# Patient Record
Sex: Female | Born: 1980 | Race: Black or African American | Hispanic: No | Marital: Single | Smoking: Current every day smoker
Health system: Southern US, Community
[De-identification: ages and names within clinical notes are randomized; demographics above are authoritative.]

## PROBLEM LIST (undated history)

## (undated) DIAGNOSIS — K8020 Calculus of gallbladder without cholecystitis without obstruction: Secondary | ICD-10-CM

## (undated) DIAGNOSIS — F329 Major depressive disorder, single episode, unspecified: Secondary | ICD-10-CM

## (undated) DIAGNOSIS — I10 Essential (primary) hypertension: Secondary | ICD-10-CM

## (undated) DIAGNOSIS — F32A Depression, unspecified: Secondary | ICD-10-CM

## (undated) DIAGNOSIS — H5440 Blindness, one eye, unspecified eye: Secondary | ICD-10-CM

## (undated) DIAGNOSIS — K219 Gastro-esophageal reflux disease without esophagitis: Secondary | ICD-10-CM

## (undated) DIAGNOSIS — J45909 Unspecified asthma, uncomplicated: Secondary | ICD-10-CM

## (undated) DIAGNOSIS — H532 Diplopia: Secondary | ICD-10-CM

## (undated) DIAGNOSIS — Z6841 Body Mass Index (BMI) 40.0 and over, adult: Secondary | ICD-10-CM

## (undated) HISTORY — DX: Unspecified asthma, uncomplicated: J45.909

## (undated) HISTORY — PX: HERNIA REPAIR: SHX51

## (undated) HISTORY — DX: Body Mass Index (BMI) 40.0 and over, adult: Z684

## (undated) HISTORY — DX: Calculus of gallbladder without cholecystitis without obstruction: K80.20

## (undated) HISTORY — PX: EYE SURGERY: SHX253

## (undated) HISTORY — DX: Morbid (severe) obesity due to excess calories: E66.01

## (undated) HISTORY — DX: Diplopia: H53.2

---

## 2005-02-16 ENCOUNTER — Emergency Department: Payer: Self-pay | Admitting: Internal Medicine

## 2005-04-23 ENCOUNTER — Emergency Department: Payer: Self-pay | Admitting: Emergency Medicine

## 2005-10-21 ENCOUNTER — Observation Stay: Payer: Self-pay

## 2005-10-28 ENCOUNTER — Observation Stay: Payer: Self-pay | Admitting: Unknown Physician Specialty

## 2005-10-30 ENCOUNTER — Ambulatory Visit: Payer: Self-pay | Admitting: Unknown Physician Specialty

## 2005-10-31 ENCOUNTER — Observation Stay: Payer: Self-pay

## 2005-11-02 ENCOUNTER — Inpatient Hospital Stay: Payer: Self-pay | Admitting: Obstetrics & Gynecology

## 2005-11-03 ENCOUNTER — Other Ambulatory Visit: Payer: Self-pay

## 2007-04-03 ENCOUNTER — Ambulatory Visit: Payer: Self-pay | Admitting: Ophthalmology

## 2007-06-26 ENCOUNTER — Emergency Department: Payer: Self-pay | Admitting: Emergency Medicine

## 2007-12-17 IMAGING — US US OB US >=[ID] SNGL FETUS
1 series · 17 of 28 positions shown · non-contrast
Comparison: none

REASON FOR EXAM: Pregnant AFT Est Fetal Wt
COMMENTS:

[Series 1: us ob us >=(id) sngl fetus · 17 of 38 slices shown]
[im 1/38]
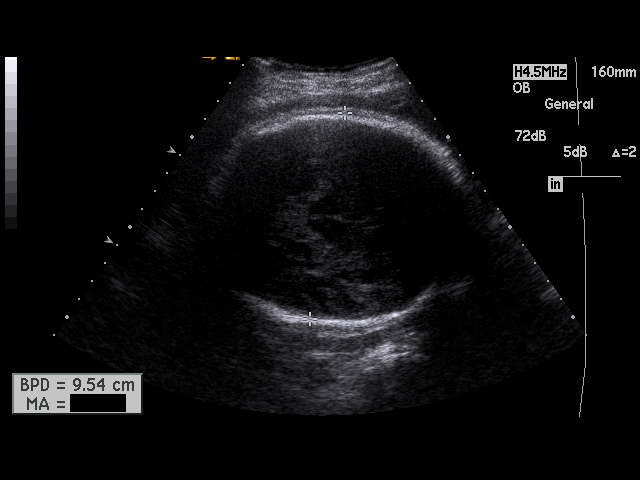
[im 3/38]
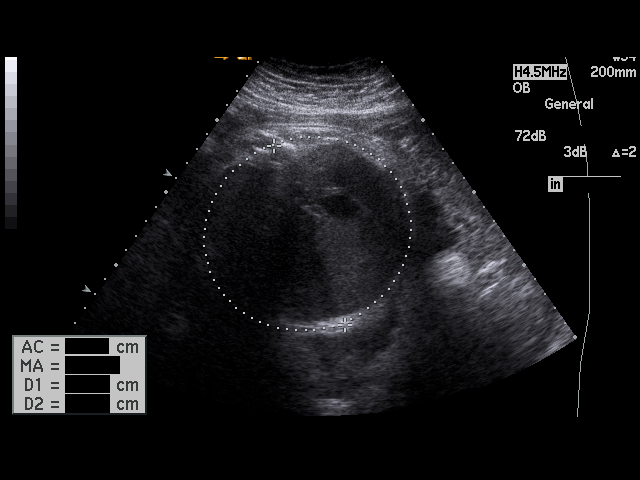
[im 6/38]
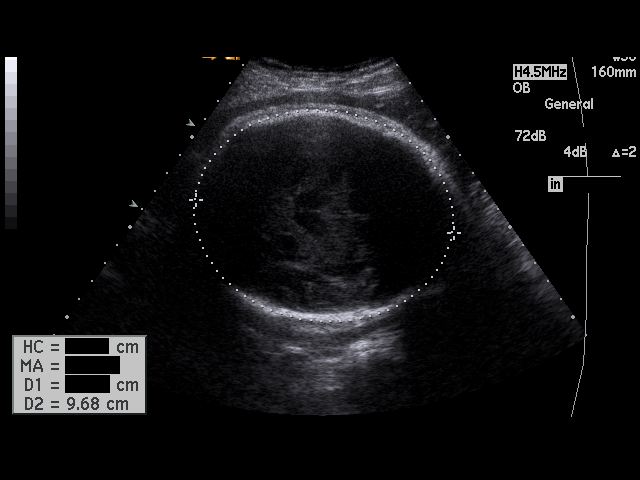
[im 7/38]
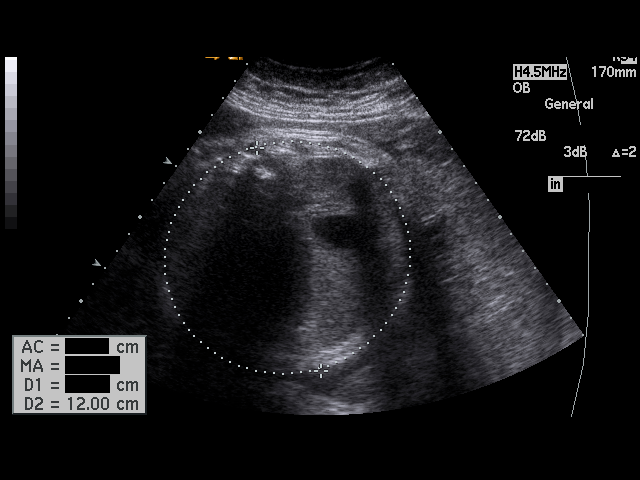
[im 10/38]
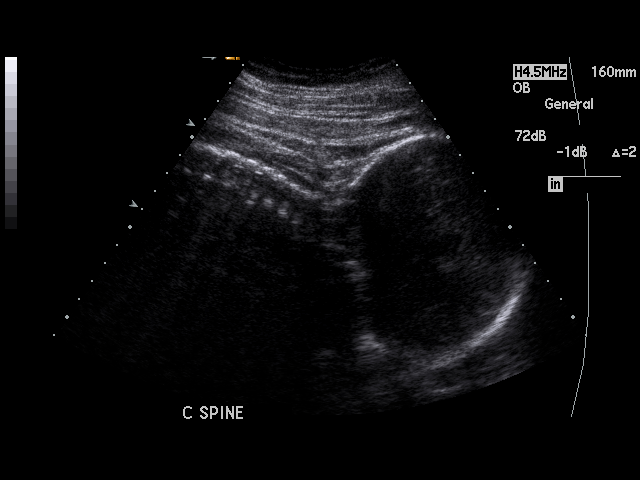
[im 13/38]
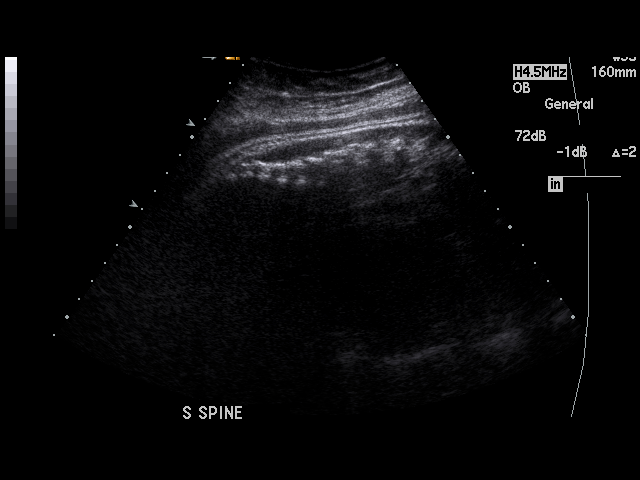
[im 14/38]
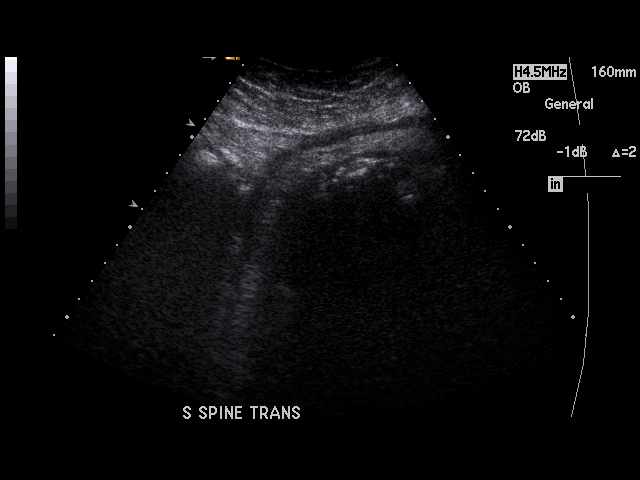
[im 17/38]
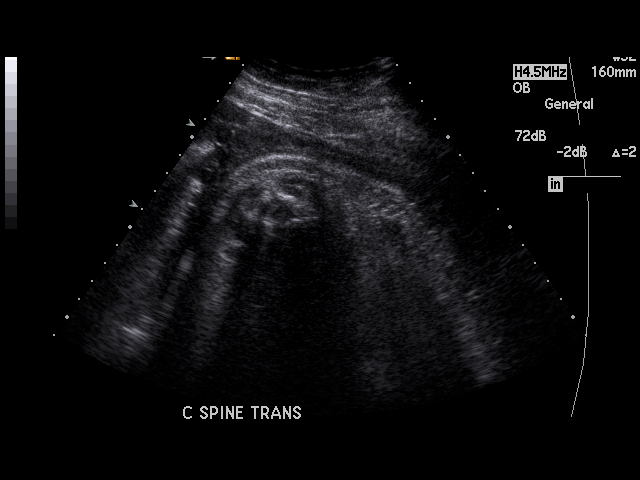
[im 20/38]
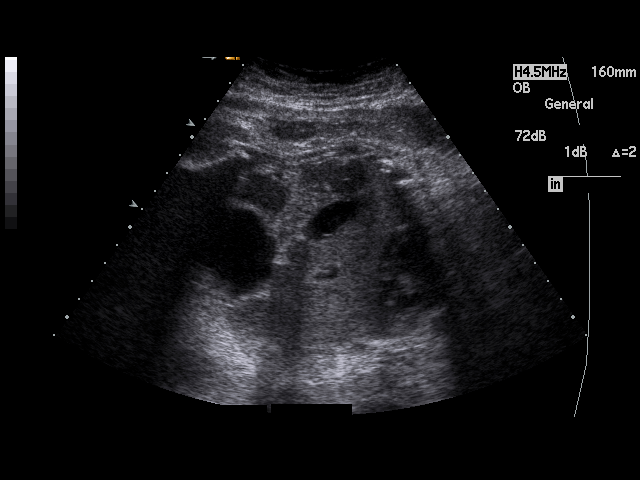
[im 21/38]
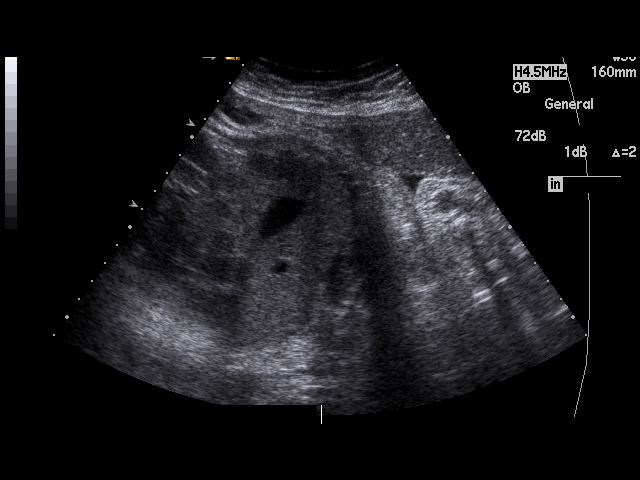
[im 24/38]
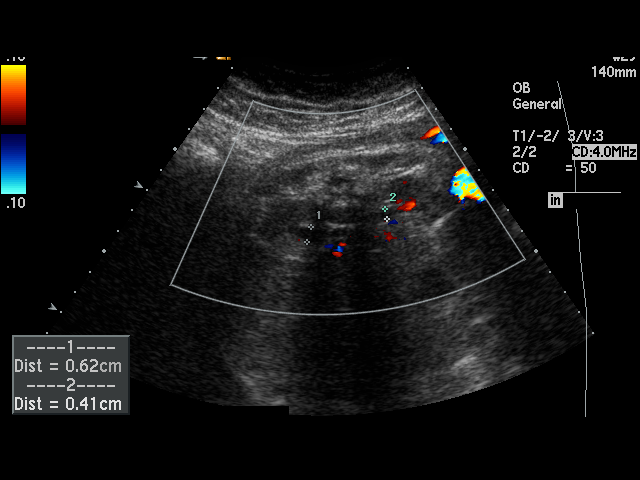
[im 25/38]
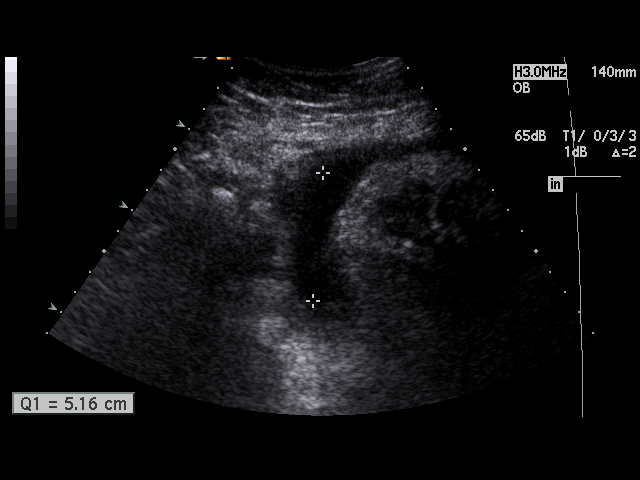
[im 28/38]
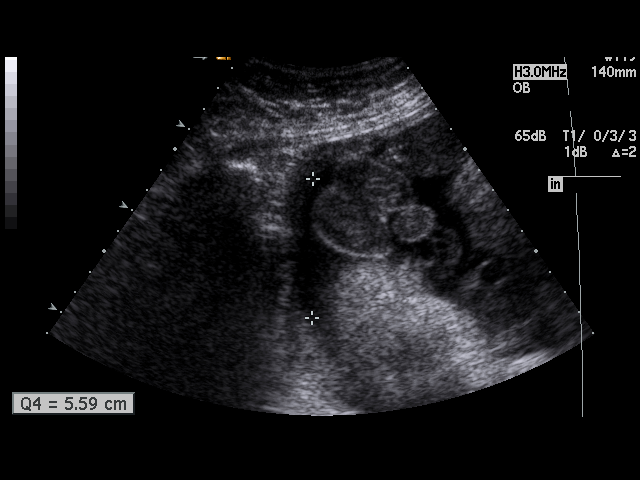
[im 31/38]
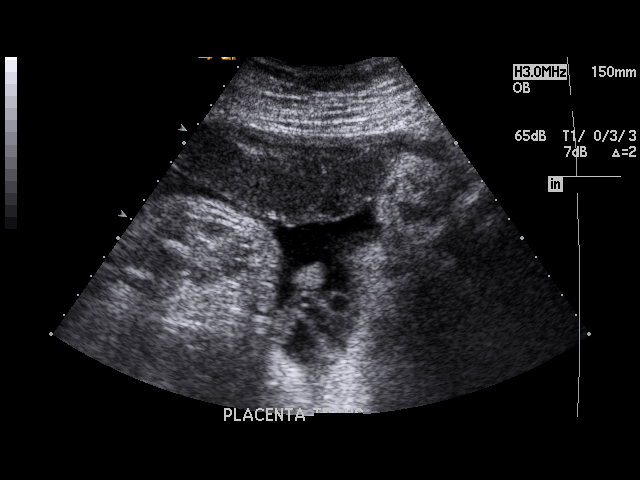
[im 32/38]
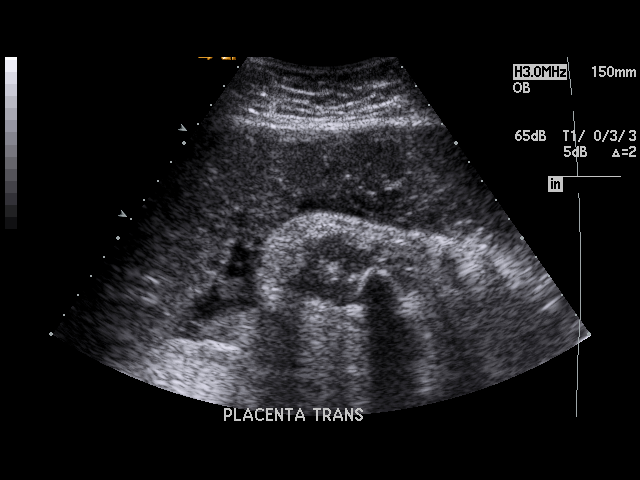
[im 35/38]
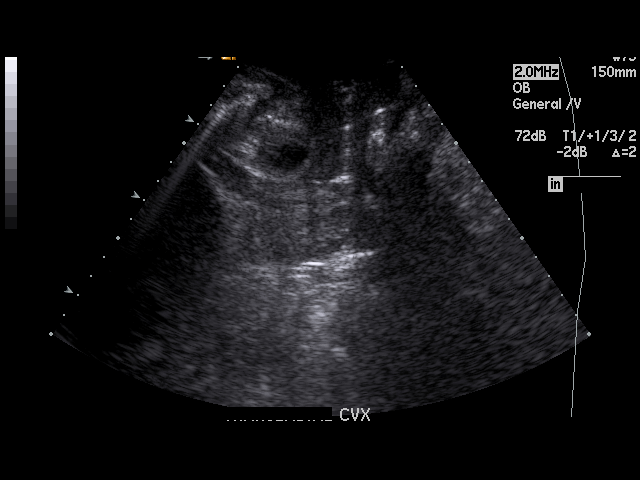
[im 38/38]
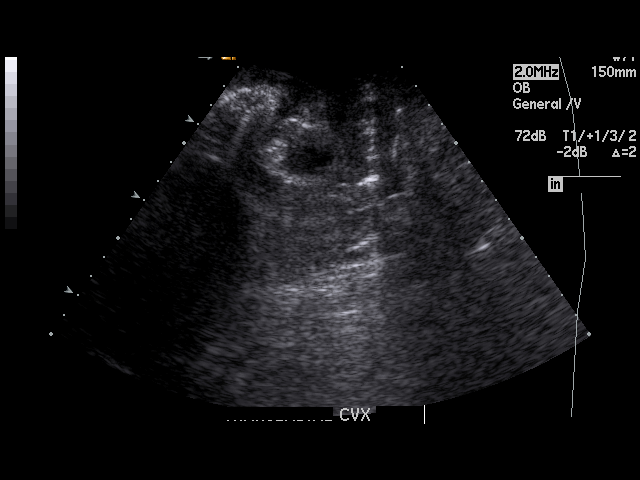

[17 of 28 positions shown; findings below may reference images not displayed]

PROCEDURE:     US  - US OB GREATER/OR EQUAL TO ZNRMS  - October 30, 2005 [DATE]

RESULT:        This study is limited secondary to the size of the fetus.

A single viable intrauterine pregnancy is appreciated with estimated
gestational age of 39 weeks 3 days.  Estimated fetal weight is 8 pounds 9
ounces corresponding to 3,890 grams.  Estimated fetal heart rate is 167
beats per minute.  Evaluation of the amniotic fluid demonstrates the
amniotic fluid index of 10.75 which is between the fifth and fiftieth
percentile.

Cardiac movement is appreciated.  The presentation is cephalic.  Limited
evaluation of the fetal anatomy demonstrates no bladder, renal, diaphragm,
spine or ventricular abnormalities.  The placenta is Grade II and is in a
LEFT lateral anterior location.

Fetal biometry:

BPD     9.45          EGA      39 weeks 3 days
HC          33.99           EGA       39 weeks 1 day
AC          36.13          EGA     40 weeks
FL          7.87          EGA      40 weeks 1 day

These measurements are in centimeters.
IMPRESSION: Single viable intrauterine pregnancy as described above.  Estimated
gestational age 39 weeks 3 days.

## 2008-03-17 ENCOUNTER — Emergency Department: Payer: Self-pay | Admitting: Emergency Medicine

## 2011-03-20 ENCOUNTER — Emergency Department: Payer: Self-pay | Admitting: Emergency Medicine

## 2011-03-20 LAB — COMPREHENSIVE METABOLIC PANEL
Albumin: 3.6 g/dL (ref 3.4–5.0)
Alkaline Phosphatase: 58 U/L (ref 50–136)
BUN: 9 mg/dL (ref 7–18)
Bilirubin,Total: 0.2 mg/dL (ref 0.2–1.0)
Calcium, Total: 9 mg/dL (ref 8.5–10.1)
Chloride: 106 mmol/L (ref 98–107)
EGFR (African American): >60
Glucose: 83 mg/dL (ref 65–99)
Potassium: 4.2 mmol/L (ref 3.5–5.1)
SGOT(AST): 158 U/L — ABNORMAL HIGH (ref 15–37)
SGPT (ALT): 127 U/L — ABNORMAL HIGH
Sodium: 144 mmol/L (ref 136–145)
Total Protein: 7.5 g/dL (ref 6.4–8.2)

## 2011-03-20 LAB — CBC
HGB: 12.2 g/dL (ref 12.0–16.0)
RBC: 3.89 10*6/uL (ref 3.80–5.20)

## 2013-06-02 DIAGNOSIS — H532 Diplopia: Secondary | ICD-10-CM | POA: Insufficient documentation

## 2013-12-18 ENCOUNTER — Ambulatory Visit: Payer: Self-pay | Admitting: Obstetrics and Gynecology

## 2013-12-18 LAB — HCG, QUANTITATIVE, PREGNANCY

## 2013-12-18 LAB — CBC
HCT: 31.1 % — ABNORMAL LOW (ref 35.0–47.0)
HGB: 9.8 g/dL — AB (ref 12.0–16.0)
MCH: 30.2 pg (ref 26.0–34.0)
MCHC: 31.6 g/dL — ABNORMAL LOW (ref 32.0–36.0)
MCV: 96 fL (ref 80–100)
Platelet: 345 10*3/uL (ref 150–440)
RBC: 3.26 10*6/uL — AB (ref 3.80–5.20)
RDW: 16.2 % — AB (ref 11.5–14.5)
WBC: 18.2 10*3/uL — AB (ref 3.6–11.0)

## 2014-04-29 NOTE — Op Note (Signed)
PATIENT NAME:  Carmen Gomez, Allisha V MR#:  [id number] DATE OF BIRTH:  1980/10/21  DATE OF PROCEDURE:  12/18/2013  PREOPERATIVE DIAGNOSIS: Vaginal laceration after intercourse.   POSTOPERATIVE DIAGNOSIS: Vaginal laceration after intercourse.   PROCEDURE: Repair of vaginal laceration.   ANESTHESIA: MAC.   SURGEON: Conard NovakStephen D. Choua Ikner, MD.   ESTIMATED BLOOD LOSS: 40 mL.   OPERATIVE FLUIDS: 400 mL crystalloid.   COMPLICATIONS: None.   FINDINGS: A 4 cm right vaginal laceration at about 7 o'clock with respect to the cervix in the proximal vagina extending inferior to the cervix for about 2 cm where the cervix and vagina connect in the fornix.   SPECIMENS: None.   CONDITION AT THE END OF PROCEDURE: Stable.   PROCEDURE IN DETAIL: The patient was taken to the Operating Room where monitored anesthesia was administered and found to be adequate. The patient was placed in the dorsal supine lithotomy position in [place] stirrups and prepped and draped in the usual sterile fashion. After a timeout was called, a sterile speculum was placed in the vagina and the vagina was explored thoroughly to ensure no other defects were missed. The distal portion of the defect was then grasped with a clamp and a running suture of 3-0 Vicryl was used to reapproximate up to the level of the cervix. This was then tied off. A second suture was used to reapproximate where the defect was in a circumferential fashion at the fornix posterior to the cervix. At the end of the procedure, there was hemostasis along the entire suture line and the defect was completely closed. There was no active bleeding at the end of the procedure. At this point, the procedure was terminated and all instrumentation was removed from the vagina.   The patient tolerated the procedure well. Sponge, lap, and needle counts were correct x 2. For VTE prophylaxis, the patient was wearing pneumatic compression stockings throughout the entire procedure. She was  awakened in the Operating Room and taken to the recovery area in stable condition.    ____________________________ Conard NovakStephen D. Khylei Wilms, MD sdj:at D: 12/18/2013 23:48:24 ET T: 12/19/2013 10:00:00 ET JOB#: [id number]  cc: Conard NovakStephen D. Leila Schuff, MD, <Dictator> Conard NovakSTEPHEN D Kamry Faraci MD ELECTRONICALLY SIGNED 01/15/2014 13:08

## 2015-05-25 ENCOUNTER — Emergency Department
Admission: EM | Admit: 2015-05-25 | Discharge: 2015-05-25 | Disposition: A | Discharge status: Home / Self Care | Payer: Medicaid Other | Attending: Emergency Medicine | Admitting: Emergency Medicine

## 2015-05-25 ENCOUNTER — Emergency Department: Payer: Medicaid Other

## 2015-05-25 DIAGNOSIS — Y999 Unspecified external cause status: Secondary | ICD-10-CM | POA: Diagnosis not present

## 2015-05-25 DIAGNOSIS — Y939 Activity, unspecified: Secondary | ICD-10-CM | POA: Insufficient documentation

## 2015-05-25 DIAGNOSIS — T188XXA Foreign body in other parts of alimentary tract, initial encounter: Secondary | ICD-10-CM | POA: Diagnosis present

## 2015-05-25 DIAGNOSIS — W228XXA Striking against or struck by other objects, initial encounter: Secondary | ICD-10-CM | POA: Insufficient documentation

## 2015-05-25 DIAGNOSIS — Y929 Unspecified place or not applicable: Secondary | ICD-10-CM | POA: Insufficient documentation

## 2015-05-25 DIAGNOSIS — T189XXA Foreign body of alimentary tract, part unspecified, initial encounter: Secondary | ICD-10-CM

## 2015-05-25 MED ORDER — LIDOCAINE VISCOUS 2 % MT SOLN: 20.0000 mL | OROMUCOSAL | Status: DC | PRN

## 2015-05-25 MED ORDER — LIDOCAINE VISCOUS 2 % MT SOLN
15.0000 mL | Freq: Once | OROMUCOSAL | Status: AC
  Administered 2015-05-25: 15 mL via OROMUCOSAL
  Filled 2015-05-25: qty 15

## 2015-05-25 NOTE — ED Provider Notes (Signed)
[facility] Emergency Department Provider Note  ____________________________________________  Time seen: Approximately 12:30 PM  I have reviewed the triage vital signs and the nursing notes.   HISTORY  Chief Complaint Foreign Body    HPI Sharen CounterDanielle V Shiflett is a 35 y.o. female is for evaluation of patient states that she swallowed a small piece of plastic from a freeze pop. States that she feels like a stuck in her throat she can't swallow showed a difficult time breathing and now she is having chest pains. Describes her level of uncomfortableness as 10 over 10   No past medical history on file.  There are no active problems to display for this patient.   No past surgical history on file.  Current Outpatient Rx  Name  Route  Sig  Dispense  Refill  . lidocaine (XYLOCAINE) 2 % solution   Mouth/Throat   Use as directed 20 mLs in the mouth or throat as needed for mouth pain.   100 mL   0     Allergies Review of patient's allergies indicates no known allergies.  No family history on file.  Social History Social History  Substance Use Topics  . Smoking status: Not on file  . Smokeless tobacco: Not on file  . Alcohol Use: Not on file    Review of Systems Constitutional: No fever/chills WUJ:WJXBJYNWENT:Positive for questionable foreign body in throat. Cardiovascular: Denies chest pain. Respiratory: Denies shortness of breath. Gastrointestinal: No abdominal pain.  No nausea, no vomiting.  No diarrhea.  No constipation. Skin: Negative for rash. Neurological: Negative for headaches, focal weakness or numbness.  10-point ROS otherwise negative.  ____________________________________________   PHYSICAL EXAM:  VITAL SIGNS: ED Triage Vitals  Enc Vitals Group     BP 05/25/15 1052 159/103 mmHg     Pulse Rate 05/25/15 1052 85     Resp 05/25/15 1052 16     Temp 05/25/15 1052 97.9 F (36.6 C)     Temp Source 05/25/15 1052 Oral     SpO2 05/25/15 1052 99  %     Weight 05/25/15 1052 300 lb (136.079 kg)     Height 05/25/15 1052 5\' 9"  (1.753 m)     Head Cir --      Peak Flow --      Pain Score --      Pain Loc --      Pain Edu? --      Excl. in GC? --     Constitutional: Alert and oriented. Well appearing and in no acute distress. Mouth/Throat: Mucous membranes are moist.  Oropharynx non-erythematous. Neck: No stridor. Range of motion nontender. Clear to auscultation.  Cardiovascular: Normal rate, regular rhythm. Grossly normal heart sounds.  Good peripheral circulation. Respiratory: Normal respiratory effort.  No retractions. Lungs CTAB. Neurologic:  Normal speech and language. No gross focal neurologic deficits are appreciated. No gait instability. Skin:  Skin is warm, dry and intact. No rash noted. Psychiatric: Mood and affect are normal. Speech and behavior are normal.  ____________________________________________   LABS (all labs ordered are listed, but only abnormal results are displayed)  Labs Reviewed - No data to display  RADIOLOGY  IMPRESSION: 1. I do not see a definite foreign body. I am uncertain whether a small piece of plastic would show up well on conventional radiography. CT could be utilized if there is a high suspicion of occult retained foreign body. ____________________________________________   PROCEDURES  Procedure(s) performed: None  Critical Care performed: No  ____________________________________________  INITIAL IMPRESSION / ASSESSMENT AND PLAN / ED COURSE  Pertinent labs & imaging results that were available during my care of the patient were reviewed by me and considered in my medical decision making (see chart for details).  Question foreign body ingestion. Patient states she feels better after the swallowing viscous lidocaine. No obvious foreign body noted on x-ray patient for CT at this time and will follow-up with PCP or return to the ER if anything  worsens. ____________________________________________   FINAL CLINICAL IMPRESSION(S) / ED DIAGNOSES  Final diagnoses:  Foreign body ingestion, initial encounter     This chart was dictated using voice recognition software/Dragon. Despite best efforts to proofread, errors can occur which can change the meaning. Any change was purely unintentional.   Evangeline Dakin, PA-C 05/25/15 1329  Jene Every, MD 05/25/15 [phone]

## 2015-05-25 NOTE — Discharge Instructions (Signed)
Swallowed Foreign Body, Adult °A swallowed foreign body means that you swallow something and it gets stuck. It might be food or something else. The object may get stuck in the tube that connects your throat to your stomach (esophagus), or it may get stuck in another part of your belly (digestive tract). It is very important to tell your doctor what you have swallowed. °Sometimes, the object will pass through your body on its own. Sometimes, the object will pass after you are given a medicine to relax your throat. The object may need to be taken out by a doctor if it is dangerous or if it will not pass through your body on its own. An object may need to be removed with surgery if: °· It gets stuck in your throat. °· You cannot swallow. °· You cannot breathe well. °· It is sharp. °· It is harmful or poisonous (toxic), like batteries or drugs. °HOME CARE °· Eat what you normally eat if your doctor says that you can. °· Keep checking your poop (stool) to see if the object has come out of your body. °· Call your doctor if the object has not come out of your body after 3 days. °If you had surgery (endoscopy) to remove the object: °· Care for yourself after surgery as told by your doctor. °Keep all follow-up visits as told by your doctor. This is important. °SEEK MEDICAL CARE IF: °· You still have problems after you have been treated. °· The object has not come out of your body after 3 days. °GET HELP RIGHT AWAY IF: °· You have a fever. °· You have pain in your chest or your belly. °· You cough up blood. °· You have blood in your poop or your throw up (vomit). °  °This information is not intended to replace advice given to you by your health care provider. Make sure you discuss any questions you have with your health care provider. °  °Document Released: 04/05/2010 Document Revised: 09/09/2014 Document Reviewed: 03/18/2014 °Elsevier Interactive Patient Education ©2016 Elsevier Inc. ° °

## 2015-05-25 NOTE — ED Notes (Signed)
Pt states she was eating a freeze pop this morning and felt like a piece of plastic got hung in her throat from the freeze pop, denies any SOB or difficulty  Swallowing, pt is in NAD..Marland Kitchen

## 2015-05-25 NOTE — ED Notes (Signed)
See triage note   States she thinks she swallowed a small piece of plastic  And feels like it is in throat  No drooling noted  Speech clear   Positive nausea

## 2015-06-25 ENCOUNTER — Emergency Department: Payer: Medicaid Other

## 2015-06-25 ENCOUNTER — Emergency Department
Admission: EM | Admit: 2015-06-25 | Discharge: 2015-06-25 | Disposition: A | Discharge status: Home / Self Care | Payer: Medicaid Other | Attending: Student | Admitting: Student

## 2015-06-25 ENCOUNTER — Encounter: Payer: Self-pay | Admitting: Emergency Medicine

## 2015-06-25 DIAGNOSIS — W19XXXA Unspecified fall, initial encounter: Secondary | ICD-10-CM | POA: Diagnosis not present

## 2015-06-25 DIAGNOSIS — Y929 Unspecified place or not applicable: Secondary | ICD-10-CM | POA: Diagnosis not present

## 2015-06-25 DIAGNOSIS — Y9389 Activity, other specified: Secondary | ICD-10-CM | POA: Diagnosis not present

## 2015-06-25 DIAGNOSIS — F17200 Nicotine dependence, unspecified, uncomplicated: Secondary | ICD-10-CM | POA: Insufficient documentation

## 2015-06-25 DIAGNOSIS — Y999 Unspecified external cause status: Secondary | ICD-10-CM | POA: Diagnosis not present

## 2015-06-25 DIAGNOSIS — S93401A Sprain of unspecified ligament of right ankle, initial encounter: Secondary | ICD-10-CM | POA: Diagnosis not present

## 2015-06-25 DIAGNOSIS — S8991XA Unspecified injury of right lower leg, initial encounter: Secondary | ICD-10-CM | POA: Diagnosis present

## 2015-06-25 DIAGNOSIS — S82831A Other fracture of upper and lower end of right fibula, initial encounter for closed fracture: Secondary | ICD-10-CM | POA: Diagnosis not present

## 2015-06-25 DIAGNOSIS — S82401A Unspecified fracture of shaft of right fibula, initial encounter for closed fracture: Secondary | ICD-10-CM

## 2015-06-25 MED ORDER — OXYCODONE-ACETAMINOPHEN 5-325 MG PO TABS
1.0000 | ORAL_TABLET | Freq: Once | ORAL | Status: AC
  Administered 2015-06-25: 1 via ORAL
  Filled 2015-06-25: qty 1

## 2015-06-25 MED ORDER — NAPROXEN 500 MG PO TBEC: 500.0000 mg | DELAYED_RELEASE_TABLET | Freq: Two times a day (BID) | ORAL | Status: DC

## 2015-06-25 MED ORDER — HYDROCODONE-ACETAMINOPHEN 5-325 MG PO TABS: 1.0000 | ORAL_TABLET | Freq: Four times a day (QID) | ORAL | Status: DC | PRN

## 2015-06-25 NOTE — ED Notes (Signed)
Dahlia ClientHannah student PA at bedside.

## 2015-06-25 NOTE — Discharge Instructions (Signed)
Cast or Splint Care Casts and splints support injured limbs and keep bones from moving while they heal.  HOME CARE  Keep the cast or splint uncovered during the drying period.  A plaster cast can take 24 to 48 hours to dry.  A fiberglass cast will dry in less than 1 hour.  Do not rest the cast on anything harder than a pillow for 24 hours.  Do not put weight on your injured limb. Do not put pressure on the cast. Wait for your doctor's approval.  Keep the cast or splint dry.  Cover the cast or splint with a plastic bag during baths or wet weather.  If you have a cast over your chest and belly (trunk), take sponge baths until the cast is taken off.  If your cast gets wet, dry it with a towel or blow dryer. Use the cool setting on the blow dryer.  Keep your cast or splint clean. Wash a dirty cast with a damp cloth.  Do not put any objects under your cast or splint.  Do not scratch the skin under the cast with an object. If itching is a problem, use a blow dryer on a cool setting over the itchy area.  Do not trim or cut your cast.  Do not take out the padding from inside your cast.  Exercise your joints near the cast as told by your doctor.  Raise (elevate) your injured limb on 1 or 2 pillows for the first 1 to 3 days. GET HELP IF:  Your cast or splint cracks.  Your cast or splint is too tight or too loose.  You itch badly under the cast.  Your cast gets wet or has a soft spot.  You have a bad smell coming from the cast.  You get an object stuck under the cast.  Your skin around the cast becomes red or sore.  You have new or more pain after the cast is put on. GET HELP RIGHT AWAY IF:  You have fluid leaking through the cast.  You cannot move your fingers or toes.  Your fingers or toes turn blue or white or are cool, painful, or puffy (swollen).  You have tingling or lose feeling (numbness) around the injured area.  You have bad pain or pressure under the  cast.  You have trouble breathing or have shortness of breath.  You have chest pain.   This information is not intended to replace advice given to you by your health care provider. Make sure you discuss any questions you have with your health care provider.   Document Released: 04/20/2010 Document Revised: 08/21/2012 Document Reviewed: 06/27/2012 Elsevier Interactive Patient Education 2016 Elsevier Inc.  Fibular Ankle Fracture Treated With or Without Immobilization, Adult A fibular fracture at your ankle is a break (fracture) bone in the smallest of the two bones in your lower leg, located on the outside of your leg (fibula) close to the area at your ankle joint. CAUSES  Rolling your ankle.  Twisting your ankle.  Extreme flexing or extending of your foot.  Severe force on your ankle as when falling from a distance. RISK FACTORS  Jumping activities.  Participation in sports.  Osteoporosis.  Advanced age.  Previous ankle injuries. SIGNS AND SYMPTOMS  Pain.  Swelling.  Inability to put weight on injured ankle.  Bruising.  Bone deformities at site of injury. DIAGNOSIS  This fracture is diagnosed with the help of an X-ray exam. TREATMENT  If the fractured  bone did not move out of place it usually will heal without problems and does casting or splinting. If immobilization is needed for comfort or the fractured bone moved out of place and will not heal properly with immobilization, a cast or splint will be used. HOME CARE INSTRUCTIONS   Apply ice to the area of injury:  Put ice in a plastic bag.  Place a towel between your skin and the bag.  Leave the ice on for 20 minutes, 2-3 times a day.  Use crutches as directed. Resume walking without crutches as directed by your health care provider.  Only take over-the-counter or prescription medicines for pain, discomfort, or fever as directed by your health care provider.  If you have a removable splint or boot, do not  remove the boot unless directed by your health care provider. SEEK MEDICAL CARE IF:   You have continued pain or more swelling  The medications do not control the pain. SEEK IMMEDIATE MEDICAL CARE IF:  You develop severe pain in the leg or foot.  Your skin or nails below the injury turn blue or grey or feel cold or numb. MAKE SURE YOU:   Understand these instructions.  Will watch your condition.  Will get help right away if you are not doing well or get worse.   This information is not intended to replace advice given to you by your health care provider. Make sure you discuss any questions you have with your health care provider.   Document Released: 12/19/2004 Document Revised: 01/09/2014 Document Reviewed: 07/31/2012 Elsevier Interactive Patient Education 2016 Elsevier Inc.   Wear the ankle splint until cleared by ortho. Follow-up with Dr. Ernest PineHooten for further evaluation and fracture management. Take the prescription meds as directed.

## 2015-06-25 NOTE — ED Notes (Signed)
Pt verbalized understanding of discharge instructions. NAD at this time. 

## 2015-06-25 NOTE — ED Provider Notes (Signed)
[facility] Emergency Department Provider Note ____________________________________________  Time seen: 1405  I have reviewed the triage vital signs and the nursing notes.  HISTORY  Chief Complaint  Fall  HPI Carmen Gomez is a 35 y.o. female presents to the ED via EMS for evaluation of injury sustained following a fall last night while emptying the trash. She describes pain to the right ankle and proximal leg. She denies any other injury at this time. She rates her pain at 10/10 in triage. She denies any measures at home to treat or alleviate pain or swelling.   History reviewed. No pertinent past medical history.  There are no active problems to display for this patient.  Past Surgical History  Procedure Laterality Date  . Eye surgery      Current Outpatient Rx  Name  Route  Sig  Dispense  Refill  . HYDROcodone-acetaminophen (NORCO) 5-325 MG tablet   Oral   Take 1 tablet by mouth every 6 (six) hours as needed for moderate pain.   15 tablet   0   . lidocaine (XYLOCAINE) 2 % solution   Mouth/Throat   Use as directed 20 mLs in the mouth or throat as needed for mouth pain.   100 mL   0   . naproxen (EC NAPROSYN) 500 MG EC tablet   Oral   Take 1 tablet (500 mg total) by mouth 2 (two) times daily with a meal.   30 tablet   0    Allergies Review of patient's allergies indicates no known allergies.  No family history on file.  Social History Social History  Substance Use Topics  . Smoking status: Light Tobacco Smoker  . Smokeless tobacco: None  . Alcohol Use: Yes    Review of Systems  Constitutional: Negative for fever. Musculoskeletal: Negative for back pain. Right ankle pain as above.  Skin: Negative for rash. Neurological: Negative for headaches, focal weakness or numbness. ____________________________________________  PHYSICAL EXAM:  VITAL SIGNS: ED Triage Vitals  Enc Vitals Group     BP 06/25/15 1402 160/87 mmHg   Pulse Rate 06/25/15 1402 94     Resp 06/25/15 1402 18     Temp 06/25/15 1402 98.3 F (36.8 C)     Temp Source 06/25/15 1402 Oral     SpO2 06/25/15 1402 98 %     Weight 06/25/15 1402 300 lb (136.079 kg)     Height 06/25/15 1402 5\' 9"  (1.753 m)     Head Cir --      Peak Flow --      Pain Score --      Pain Loc --      Pain Edu? --      Excl. in GC? --    Constitutional: Alert and oriented. Well appearing and in no distress. Head: Normocephalic and atraumatic. Cardiovascular: Normal distal pulses and cap refill.   Respiratory: Normal respiratory effort.  Musculoskeletal: Patient with subtle swelling to the lateral aspect of her right ankle. She is with tenderness to palpation over the anterior distal shin and lateral malleolus. She otherwise is noted to have normal ankle range of motion and resistance testing. No appreciable calf or Achilles tenderness is appreciated. Nontender with normal range of motion in all extremities.  Neurologic:  Normal gait without ataxia. Normal speech and language. No gross focal neurologic deficits are appreciated. Skin:  Skin is warm, dry and intact. No rash noted. ____________________________________________   RADIOLOGY  Right Ankle  IMPRESSION: Minimally displaced  oblique fracture distal RIGHT fibular Metadiaphysis.  I, Saleemah Mollenhauer, Charlesetta IvoryJenise V Bacon, personally viewed and evaluated these images (plain radiographs) as part of my medical decision making, as well as reviewing the written report by the radiologist. ____________________________________________  PROCEDURES  Percocet 5-325mg  PO OCL stirrup splint Walker  ____________________________________________  INITIAL IMPRESSION / ASSESSMENT AND PLAN / ED COURSE  Initial fracture care for a patient with a closed distal fibula fracture on the right. She is placed in a OCL stirrup splint and provided with a walker for ambulation. She will follow-up with Dr. Ernest PineHooten for ongoing fracture care. A  prescription for hydrocodone and EC Naprosyn are provided for pain relief. ____________________________________________  FINAL CLINICAL IMPRESSION(S) / ED DIAGNOSES  Final diagnoses:  Closed fibular fracture, right, initial encounter  Ankle sprain, right, initial encounter     Lissa HoardJenise V Bacon Leviathan Macera, PA-C 06/25/15 1554  Emily FilbertJonathan E Williams, MD 06/26/15 [phone]

## 2016-05-19 ENCOUNTER — Emergency Department
Admission: EM | Admit: 2016-05-19 | Discharge: 2016-05-19 | Disposition: A | Discharge status: Home / Self Care | Payer: Medicaid Other | Attending: Emergency Medicine | Admitting: Emergency Medicine

## 2016-05-19 ENCOUNTER — Encounter: Payer: Self-pay | Admitting: Emergency Medicine

## 2016-05-19 ENCOUNTER — Emergency Department: Payer: Medicaid Other

## 2016-05-19 ENCOUNTER — Telehealth: Payer: Self-pay | Admitting: Emergency Medicine

## 2016-05-19 DIAGNOSIS — R05 Cough: Secondary | ICD-10-CM | POA: Diagnosis not present

## 2016-05-19 DIAGNOSIS — R079 Chest pain, unspecified: Secondary | ICD-10-CM | POA: Insufficient documentation

## 2016-05-19 DIAGNOSIS — Z5321 Procedure and treatment not carried out due to patient leaving prior to being seen by health care provider: Secondary | ICD-10-CM | POA: Insufficient documentation

## 2016-05-19 DIAGNOSIS — F17200 Nicotine dependence, unspecified, uncomplicated: Secondary | ICD-10-CM | POA: Insufficient documentation

## 2016-05-19 LAB — BASIC METABOLIC PANEL
ANION GAP: 11 (ref 5–15)
BUN: 9 mg/dL (ref 6–20)
CALCIUM: 9.1 mg/dL (ref 8.9–10.3)
CHLORIDE: 103 mmol/L (ref 101–111)
CO2: 22 mmol/L (ref 22–32)
Creatinine, Ser: 0.66 mg/dL (ref 0.44–1.00)
GFR calc non Af Amer: >60 mL/min (ref >60)
GLUCOSE: 90 mg/dL (ref 65–99)
POTASSIUM: 3.5 mmol/L (ref 3.5–5.1)
Sodium: 136 mmol/L (ref 135–145)

## 2016-05-19 LAB — CBC
HEMATOCRIT: 37.6 % (ref 35.0–47.0)
HEMOGLOBIN: 12.3 g/dL (ref 12.0–16.0)
MCH: 29.5 pg (ref 26.0–34.0)
MCHC: 32.7 g/dL (ref 32.0–36.0)
MCV: 90.1 fL (ref 80.0–100.0)
Platelets: 270 10*3/uL (ref 150–440)
RBC: 4.18 MIL/uL (ref 3.80–5.20)
RDW: 16.2 % — ABNORMAL HIGH (ref 11.5–14.5)
WBC: 6.4 10*3/uL (ref 3.6–11.0)

## 2016-05-19 LAB — TROPONIN I: Troponin I: <0.03 ng/mL (ref <0.03)

## 2016-05-19 NOTE — Telephone Encounter (Signed)
Called patient due to lwot to inquire about condition and follow up plans. Left message.   

## 2016-05-19 NOTE — ED Triage Notes (Signed)
Pt to triage via EMS from home, report cough and CP x 1 week, states not productive, pt denies hx of any lung or heart problems.

## 2016-08-13 ENCOUNTER — Other Ambulatory Visit: Payer: Self-pay

## 2016-08-13 ENCOUNTER — Emergency Department: Payer: Medicaid Other

## 2016-08-13 ENCOUNTER — Emergency Department
Admission: EM | Admit: 2016-08-13 | Discharge: 2016-08-13 | Disposition: A | Discharge status: Home / Self Care | Payer: Medicaid Other | Attending: Emergency Medicine | Admitting: Emergency Medicine

## 2016-08-13 ENCOUNTER — Encounter: Payer: Self-pay | Admitting: Emergency Medicine

## 2016-08-13 DIAGNOSIS — K859 Acute pancreatitis without necrosis or infection, unspecified: Secondary | ICD-10-CM | POA: Diagnosis not present

## 2016-08-13 DIAGNOSIS — F17210 Nicotine dependence, cigarettes, uncomplicated: Secondary | ICD-10-CM | POA: Insufficient documentation

## 2016-08-13 DIAGNOSIS — K8020 Calculus of gallbladder without cholecystitis without obstruction: Secondary | ICD-10-CM

## 2016-08-13 DIAGNOSIS — R1011 Right upper quadrant pain: Secondary | ICD-10-CM | POA: Diagnosis not present

## 2016-08-13 DIAGNOSIS — R109 Unspecified abdominal pain: Secondary | ICD-10-CM | POA: Diagnosis present

## 2016-08-13 LAB — URINALYSIS, COMPLETE (UACMP) WITH MICROSCOPIC
BILIRUBIN URINE: NEGATIVE
Glucose, UA: NEGATIVE mg/dL
Hgb urine dipstick: NEGATIVE
KETONES UR: 5 mg/dL — AB
LEUKOCYTES UA: NEGATIVE
Nitrite: NEGATIVE
PROTEIN: NEGATIVE mg/dL
Specific Gravity, Urine: 1.006 (ref 1.005–1.030)
WBC UA: NONE SEEN WBC/hpf (ref 0–5)
pH: 5 (ref 5.0–8.0)

## 2016-08-13 LAB — CBC
HEMATOCRIT: 37.4 % (ref 35.0–47.0)
HEMOGLOBIN: 12.5 g/dL (ref 12.0–16.0)
MCH: 30.7 pg (ref 26.0–34.0)
MCHC: 33.4 g/dL (ref 32.0–36.0)
MCV: 91.7 fL (ref 80.0–100.0)
Platelets: 333 10*3/uL (ref 150–440)
RBC: 4.08 MIL/uL (ref 3.80–5.20)
RDW: 17.5 % — ABNORMAL HIGH (ref 11.5–14.5)
WBC: 7.2 10*3/uL (ref 3.6–11.0)

## 2016-08-13 LAB — COMPREHENSIVE METABOLIC PANEL
ALT: 117 U/L — AB (ref 14–54)
ANION GAP: 12 (ref 5–15)
AST: 144 U/L — ABNORMAL HIGH (ref 15–41)
Albumin: 4.1 g/dL (ref 3.5–5.0)
Alkaline Phosphatase: 98 U/L (ref 38–126)
BUN: 7 mg/dL (ref 6–20)
CHLORIDE: 102 mmol/L (ref 101–111)
CO2: 24 mmol/L (ref 22–32)
CREATININE: 0.82 mg/dL (ref 0.44–1.00)
Calcium: 8.7 mg/dL — ABNORMAL LOW (ref 8.9–10.3)
GFR calc non Af Amer: >60 mL/min (ref >60)
Glucose, Bld: 126 mg/dL — ABNORMAL HIGH (ref 65–99)
POTASSIUM: 3.9 mmol/L (ref 3.5–5.1)
SODIUM: 138 mmol/L (ref 135–145)
Total Bilirubin: 0.3 mg/dL (ref 0.3–1.2)
Total Protein: 7.6 g/dL (ref 6.5–8.1)

## 2016-08-13 LAB — LIPASE, BLOOD: LIPASE: 64 U/L — AB (ref 11–51)

## 2016-08-13 LAB — TROPONIN I: Troponin I: <0.03 ng/mL (ref <0.03)

## 2016-08-13 MED ORDER — OXYCODONE-ACETAMINOPHEN 5-325 MG PO TABS
1.0000 | ORAL_TABLET | Freq: Once | ORAL | Status: AC
  Administered 2016-08-13: 1 via ORAL
  Filled 2016-08-13: qty 1

## 2016-08-13 MED ORDER — ONDANSETRON 4 MG PO TBDP
4.0000 mg | ORAL_TABLET | Freq: Once | ORAL | Status: AC
  Administered 2016-08-13: 4 mg via ORAL
  Filled 2016-08-13: qty 1

## 2016-08-13 MED ORDER — ONDANSETRON 4 MG PO TBDP: 4.0000 mg | ORAL_TABLET | Freq: Three times a day (TID) | ORAL | 0 refills | Status: DC | PRN

## 2016-08-13 MED ORDER — OXYCODONE-ACETAMINOPHEN 5-325 MG PO TABS: 1.0000 | ORAL_TABLET | ORAL | 0 refills | Status: DC | PRN

## 2016-08-13 NOTE — ED Notes (Addendum)
Pt waiting patiently for treatment room; understands an MD has reviewed her results and has ordered an US; understands NPO until cleared by MD

## 2016-08-13 NOTE — ED Notes (Signed)
Pt is getting a ride with a cousin. Pt is not driving.

## 2016-08-13 NOTE — ED Notes (Signed)
Pt in WR, resting with eyes closed and even unlabored respirations;

## 2016-08-13 NOTE — ED Notes (Signed)
Radiology tech out to lobby to take pt for ordered US but was unable to locate her in the WR; pt found at vending machines having already purchased chips and in the process of purchasing a drink; pt has already been informed twice that she shouldn't eat or drink until cleared by MD; pt rolled eyes at this nurse and then got her change back from the drink machine; ambulatory with steady gait to [place]

## 2016-08-13 NOTE — ED Notes (Signed)

## 2016-08-13 NOTE — ED Provider Notes (Signed)
[facility] Emergency Department Provider Note   ____________________________________________   First MD Initiated Contact with Patient 08/13/16 [phone]     (approximate)  I have reviewed the triage vital signs and the nursing notes.   HISTORY  Chief Complaint Abdominal Pain    HPI Carmen Gomez is a 36 y.o. female who presents to the ED from home via EMS with a chief complaint of abdominal pain.Patient reports onset of epigastric pain for one hour. Describes cramping type pain which radiates down to her vagina and rectum. Denies associated fever, chills, chest pain, shortness of breath, nausea, vomiting, diarrhea. Denies recent travel or trauma. Did not take anything for the pain.   Past medical history None  There are no active problems to display for this patient.   Past Surgical History:  Procedure Laterality Date  . EYE SURGERY      Prior to Admission medications   Not on File    Allergies Aspirin and Bee venom  History reviewed. No pertinent family history.  Social History Social History  Substance Use Topics  . Smoking status: Current Every Day Smoker    Packs/day: 0.50    Types: Cigarettes  . Smokeless tobacco: Never Used  . Alcohol use Yes     Comment: 2-3 beers "something like that" tonight    Review of Systems  Constitutional: No fever/chills. Eyes: No visual changes. ENT: No sore throat. Cardiovascular: Denies chest pain. Respiratory: Denies shortness of breath. Gastrointestinal: Positive for abdominal pain.  No nausea, no vomiting.  No diarrhea.  No constipation. Genitourinary: Negative for dysuria. Musculoskeletal: Negative for back pain. Skin: Negative for rash. Neurological: Negative for headaches, focal weakness or numbness.   ____________________________________________   PHYSICAL EXAM:  VITAL SIGNS: ED Triage Vitals  Enc Vitals Group     BP 08/13/16 0206 131/90     Pulse Rate 08/13/16 0206 99       Resp 08/13/16 0206 18     Temp 08/13/16 0206 98.4 F (36.9 C)     Temp Source 08/13/16 0206 Oral     SpO2 08/13/16 0206 97 %     Weight --      Height 08/13/16 0205 5\' 8"  (1.727 m)     Head Circumference --      Peak Flow --      Pain Score 08/13/16 0205 8     Pain Loc --      Pain Edu? --      Excl. in GC? --     Constitutional: Asleep, awakened for exam. Alert and oriented. Well appearing and in no acute distress. Eyes: Conjunctivae are normal. PERRL. EOMI. Head: Atraumatic. Nose: No congestion/rhinnorhea. Mouth/Throat: Mucous membranes are moist.  Oropharynx non-erythematous. Neck: No stridor.   Cardiovascular: Normal rate, regular rhythm. Grossly normal heart sounds.  Good peripheral circulation. Respiratory: Normal respiratory effort.  No retractions. Lungs CTAB. Gastrointestinal: Obese. Soft and mildly tender to palpation epigastrium and right upper quadrant without rebound or guarding. No distention. No abdominal bruits. No CVA tenderness. Musculoskeletal: No lower extremity tenderness nor edema.  No joint effusions. Neurologic:  Normal speech and language. No gross focal neurologic deficits are appreciated. No gait instability. Skin:  Skin is warm, dry and intact. No rash noted. Psychiatric: Mood and affect are normal. Speech and behavior are normal.  ____________________________________________   LABS (all labs ordered are listed, but only abnormal results are displayed)  Labs Reviewed  LIPASE, BLOOD - Abnormal; Notable for the following:  Result Value   Lipase 64 (*)    All other components within normal limits  COMPREHENSIVE METABOLIC PANEL - Abnormal; Notable for the following:    Glucose, Bld 126 (*)    Calcium 8.7 (*)    AST 144 (*)    ALT 117 (*)    All other components within normal limits  CBC - Abnormal; Notable for the following:    RDW 17.5 (*)    All other components within normal limits  URINALYSIS, COMPLETE (UACMP) WITH MICROSCOPIC -  Abnormal; Notable for the following:    Color, Urine YELLOW (*)    APPearance CLEAR (*)    Ketones, ur 5 (*)    Bacteria, UA RARE (*)    Squamous Epithelial / LPF 0-5 (*)    All other components within normal limits  TROPONIN I   ____________________________________________  EKG  ED ECG REPORT I, Teancum Brule J, the attending physician, personally viewed and interpreted this ECG.   Date: 08/13/2016  EKG Time: 0212  Rate: 92  Rhythm: normal EKG, normal sinus rhythm  Axis: Normal  Intervals:none  ST&T Change: Nonspecific  ____________________________________________  RADIOLOGY  Dg Chest 2 View  Result Date: 08/13/2016 CLINICAL DATA:  Subacute onset of central chest pain and shortness of breath. Initial encounter. EXAM: CHEST  2 VIEW COMPARISON:  Chest radiograph performed 05/19/2016 FINDINGS: The lungs are well-aerated and clear. There is no evidence of focal opacification, pleural effusion or pneumothorax. The heart is normal in size; the mediastinal contour is within normal limits. No acute osseous abnormalities are seen. IMPRESSION: No acute cardiopulmonary process seen. Electronically Signed   By: Roanna RaiderJeffery  Chang M.D.   On: 08/13/2016 02:34   K[place]as Abdomen Limited Ruq  Result Date: 08/13/2016 CLINICAL DATA:  36 y/o F; all epigastric pain both the, nausea, vomiting, elevated lipase. EXAM: ULTRASOUND ABDOMEN LIMITED RIGHT UPPER QUADRANT COMPARISON:  None. FINDINGS: Gallbladder: No gallbladder wall thickening or pericholecystic fluid and negative sonographic Murphy's sign. Multiple gallstones measuring up to 12 mm. Common bile duct: Diameter: 4.2 mm Liver: Diffusely increased liver echogenicity without a focal lesion IMPRESSION: Hepatic steatosis and cholelithiasis.  No acute process identified. Electronically Signed   By: Mitzi HansenLance  Furusawa-Stratton M.D.   On: 08/13/2016 03:51    ____________________________________________   PROCEDURES  Procedure(s) performed:  None  Procedures  Critical Care performed: No  ____________________________________________   INITIAL IMPRESSION / ASSESSMENT AND PLAN / ED COURSE  Pertinent labs & imaging results that were available during my care of the patient were reviewed by me and considered in my medical decision making (see chart for details).  36 year old female who presents with epigastric and right upper quadrant pain for an hour prior to arrival. Had lab work and ultrasound prior to my examination. Cholelithiasis noted without evidence for cholecystitis. Laboratory results are remarkable for mildly elevated lipase and transaminases. Will discharge home with analgesia and antiemetic, and patient will follow-up with general surgery. Strict return precautions given. Patient verbalizes understanding and agrees with plan of care.      ____________________________________________   FINAL CLINICAL IMPRESSION(S) / ED DIAGNOSES  Final diagnoses:  Right upper quadrant abdominal pain  Calculus of gallbladder without cholecystitis without obstruction  Acute pancreatitis without infection or necrosis, unspecified pancreatitis type      NEW MEDICATIONS STARTED DURING THIS VISIT:  New Prescriptions   No medications on file     Note:  This document was prepared using Dragon voice recognition software and may include unintentional dictation errors.  Irean Hong, MD 08/13/16 [phone]

## 2016-08-13 NOTE — ED Triage Notes (Signed)
EMS pt from home with c/o epigastric pain for one hour; denies nausea/vomiting; pain radiates down abd to vagina and rectum; denies urinary signs/symptoms; denies changes in bowel movements; pt says she had consumed 2-3 beers tonight and was babysitting when pain started; smokes marijuana but it's been several days;

## 2016-08-13 NOTE — ED Notes (Signed)
Pt easily awakened for vital signs recheck; denies pain at this time

## 2016-08-13 NOTE — Discharge Instructions (Signed)
1. Take medicines as needed for pain & nausea (Percocet/Zofran #30). 2. Clear liquids x 12 hours, then bland diet x 1 week, then slowly advance diet as tolerated. Avoid fatty, greasy, spicy foods and drinks. 3. Return to the ER for worsening symptoms, persistent vomiting, fever, difficulty breathing or other concerns.  

## 2016-08-29 DIAGNOSIS — Z6841 Body Mass Index (BMI) 40.0 and over, adult: Secondary | ICD-10-CM

## 2016-08-29 DIAGNOSIS — J45909 Unspecified asthma, uncomplicated: Secondary | ICD-10-CM | POA: Insufficient documentation

## 2016-08-31 ENCOUNTER — Encounter: Payer: Self-pay | Admitting: Surgery

## 2016-08-31 ENCOUNTER — Ambulatory Visit (INDEPENDENT_AMBULATORY_CARE_PROVIDER_SITE_OTHER): Payer: Medicaid Other | Admitting: Surgery

## 2016-08-31 DIAGNOSIS — R1084 Generalized abdominal pain: Secondary | ICD-10-CM

## 2016-08-31 NOTE — Progress Notes (Signed)
Surgical Consultation  08/31/2016  Carmen Gomez is an 36 y.o. female.   Chief Complaint  Patient presents with  . Abdominal Pain  . Elevated Hepatic Enzymes     HPI: This is a 36 year old female seen after recent ER visit for right upper quadrant pain. She reports that about 2 weeks ago went to the emergency room and started having some severe right upper quadrant pain. Pain is intermittent, sharp and is asked exacerbated by certain meals. She developed some nausea but no vomiting. No fevers or chills. I have personally reviewed her ultrasound showing evidence of gallstones. No evidence of cholecystitis. Normal common bile duct. Of note her LFTs are elevated but her alkaline phosphatase and bilirubin are normal limits. Her white count was normal as well as her hemoglobin. She admits minutes to drinking several beers in a week and sometimes some liquor.  Past Medical History:  Diagnosis Date  . Asthma   . Diplopia   . Morbid obesity with BMI of 45.0-49.9, adult [facility])     Past Surgical History:  Procedure Laterality Date  . EYE SURGERY      Family History  Problem Relation Age of Onset  . Stroke Sister 5  . Diabetes Maternal Grandmother     Social History:  reports that she has been smoking Cigarettes.  She has been smoking about 0.50 packs per day. She has never used smokeless tobacco. She reports that she drinks alcohol. She reports that she uses drugs, including Marijuana.  Allergies:  Allergies  Allergen Reactions  . Bee Venom Swelling  . Aspirin Nausea And Vomiting    Medications reviewed.    ROS Full ROS performed and is otherwise negative other than what is stated in the HPI    BP (!) 158/109 (BP Location: Right Arm)   Pulse 93   Temp 98.2 F (36.8 C) (Oral)   Ht 5\' 8"  (1.727 m)   Wt 133.4 kg (294 lb)   LMP  (LMP Unknown)   BMI 44.70 kg/m   Physical Exam  Constitutional: She is oriented to person, place, and time and well-developed,  well-nourished, and in no distress. No distress.  Morbidly obese  Eyes: Right eye exhibits no discharge. Left eye exhibits no discharge. No scleral icterus.  Neck: Normal range of motion. No JVD present. No tracheal deviation present. No thyromegaly present.  Cardiovascular: Normal rate and regular rhythm.   Pulmonary/Chest: Effort normal. No stridor. No respiratory distress. She has no wheezes. She has no rales.  Abdominal: Soft. She exhibits no distension and no mass. There is no rebound and no guarding.  Mild right upper quadrant tenderness. No Murphy no peritonitis. There is an umbilical hernia that is reducible but due to her patient body habitus is difficult to assess the actual size of the defect  Musculoskeletal: Normal range of motion. She exhibits no edema.  Neurological: She is alert and oriented to person, place, and time. Gait normal. GCS score is 15.  Skin: Skin is warm and dry. She is not diaphoretic.  Psychiatric: Mood, memory, affect and judgment normal.  Nursing note and vitals reviewed.   Assessment/Plan: 36 year old obese female with right upper quadrant pain consistent with biliary colic. Patient does have significant EtOH abuse and some other GI symptoms. She has a concomitant umbilical hernia that probably will need to be repaired at the same time. Given the new umbilical hernia and the difficulty to assess its size on my physical exam and all the other GI symptoms I  will obtain a CT scan of the abdomen and pelvis. I'll also repeat LFTs as well as INR and PTT to assess her liver function. Once we have all this workup complete I will see her back and depending on findings will discussed with her about Her scopic cholecystectomy with umbilical hernia repair. Extensive counseling provided. I also counseled her about alcohol cessation. We will also refer her to primary care physician for control of her hypertension and other comorbidities before surgical intervention is  attempted  - CT Abdomen Pelvis W Contrast; Future - Protime-INR; Future - APTT; Future - CBC with Differential/Platelet; Future - Comprehensive metabolic panel; Future - Bilirubin, fractionated(tot/dir/indir); Future   Sterling Bigiego Selassie Spatafore, MD [facility] General Surgeon

## 2016-08-31 NOTE — Patient Instructions (Addendum)
We have ordered some labs to be drawn after you have stopped drinking for at least 5 days. Please go to the [facility] in 5 days after you have stopped drinking to have these tests completed. You will check in at the registration desk in the medical mall. Please see walking directions below if needed.  We have scheduled you for a CT Scan of your Abdomen and Pelvis. This has been scheduled on 09/12/16 at our Outpatient imaging location. Please Check-in at 1115am, 15 minutes prior to your scheduled appointment. If you need to reschedule your Scan, you may do so by calling [phone]. You will need to pick up a prep kit at least 24 hours in advance of your Scan: You may pick this up at the [facility] department at [facility], or [employer]. Bring a list of medications with you to your appointment and you may have nothing to eat or drink 4 hours prior to your CT Scan.   The outpatient imaging center is off of [employer]. The address to this location is: [address] [place], [place] [address]. The number to this location in case you are lost is, [phone].  We have also set you up for an account at [facility]. But, they do not currently have new patient appointments available. Please call Princella Ion the 1st week in September to be placed on October schedule.  WE WILL SEE YOU BACK AFTER EVERYTHING ABOVE HAS BEEN COMPLETED TO REVIEW RESULTS AND DISCUSS SURGERY. WE WILL CALL YOU WITH THIS APPOINTMENT.  Please see the information below and if you begin to have any symptoms of a stroke, please call 911 immediately.    Stroke Prevention Some health problems and behaviors may make it more likely for you to have a stroke. Below are ways to lessen your risk of having a stroke.  Be active for at least 30 minutes on most or all days.  Do not smoke. Try not to be around others who smoke.  Do not drink too much alcohol. ? Do not have more than 2  drinks a day if you are a man. ? Do not have more than 1 drink a day if you are a woman and are not pregnant.  Eat healthy foods, such as fruits and vegetables. If you were put on a specific diet, follow the diet as told.  Keep your cholesterol levels under control through diet and medicines. Look for foods that are low in saturated fat, trans fat, cholesterol, and are high in fiber.  If you have diabetes, follow all diet plans and take your medicine as told.  Ask your doctor if you need treatment to lower your blood pressure. If you have high blood pressure (hypertension), follow all diet plans and take your medicine as told by your doctor.  If you are 74-34 years old, have your blood pressure checked every 3-5 years. If you are age 25 or older, have your blood pressure checked every year.  Keep a healthy weight. Eat foods that are low in calories, salt, saturated fat, trans fat, and cholesterol.  Do not take drugs.  Avoid birth control pills, if this applies. Talk to your doctor about the risks of taking birth control pills.  Talk to your doctor if you have sleep problems (sleep apnea).  Take all medicine as told by your doctor. ? You may be told to take aspirin or blood thinner medicine. Take this medicine as told by your  doctor. ? Understand your medicine instructions.  Make sure any other conditions you have are being taken care of.  Get help right away if:  You suddenly lose feeling (you feel numb) or have weakness in your face, arm, or leg.  Your face or eyelid hangs down to one side.  You suddenly feel confused.  You have trouble talking (aphasia) or understanding what people are saying.  You suddenly have trouble seeing in one or both eyes.  You suddenly have trouble walking.  You are dizzy.  You lose your balance or your movements are clumsy (uncoordinated).  You suddenly have a very bad headache and you do not know the cause.  You have new chest pain.  Your  heart feels like it is fluttering or skipping a beat (irregular heartbeat). Do not wait to see if the symptoms above go away. Get help right away. Call your local emergency services (911 in [place] Do not drive yourself to the hospital. This information is not intended to replace advice given to you by your health care provider. Make sure you discuss any questions you have with your health care provider. Document Released: 06/20/2011 Document Revised: 05/27/2015 Document Reviewed: 06/21/2012 Elsevier Interactive Patient Education  [employer].

## 2016-09-12 ENCOUNTER — Telehealth: Payer: Self-pay | Admitting: General Surgery

## 2016-09-12 ENCOUNTER — Ambulatory Visit: Admission: RE | Admit: 2016-09-12 | Payer: Medicaid Other | Source: Ambulatory Visit

## 2016-09-12 NOTE — Telephone Encounter (Signed)
Left a voicemail requesting a call from the nurse in reference to labs - Please advise

## 2016-09-14 ENCOUNTER — Telehealth: Payer: Self-pay | Admitting: General Practice

## 2016-09-14 NOTE — Telephone Encounter (Signed)
Patient has called and wanted to reschedule her CT scan and the patient was given the number to central scheduling to reschedule.

## 2016-09-14 NOTE — Telephone Encounter (Signed)
Noted! Thank you

## 2016-09-18 ENCOUNTER — Ambulatory Visit: Payer: Medicaid Other

## 2016-09-25 ENCOUNTER — Ambulatory Visit: Admission: RE | Admit: 2016-09-25 | Payer: Medicaid Other | Source: Ambulatory Visit

## 2016-09-26 ENCOUNTER — Other Ambulatory Visit
Admission: RE | Admit: 2016-09-26 | Discharge: 2016-09-26 | Disposition: A | Discharge status: Home / Self Care | Payer: Medicaid Other | Source: Ambulatory Visit | Attending: Surgery | Admitting: Surgery

## 2016-09-26 DIAGNOSIS — R1084 Generalized abdominal pain: Secondary | ICD-10-CM | POA: Diagnosis present

## 2016-09-26 LAB — CBC WITH DIFFERENTIAL/PLATELET
BASOS ABS: 0 10*3/uL (ref 0–0.1)
Basophils Relative: 0 %
EOS PCT: 2 %
Eosinophils Absolute: 0.2 10*3/uL (ref 0–0.7)
HCT: 34.3 % — ABNORMAL LOW (ref 35.0–47.0)
Hemoglobin: 11.1 g/dL — ABNORMAL LOW (ref 12.0–16.0)
LYMPHS PCT: 18 %
Lymphs Abs: 1.7 10*3/uL (ref 1.0–3.6)
MCH: 29.4 pg (ref 26.0–34.0)
MCHC: 32.3 g/dL (ref 32.0–36.0)
MCV: 91 fL (ref 80.0–100.0)
MONO ABS: 0.7 10*3/uL (ref 0.2–0.9)
MONOS PCT: 8 %
NEUTROS ABS: 6.8 10*3/uL — AB (ref 1.4–6.5)
Neutrophils Relative %: 72 %
PLATELETS: 313 10*3/uL (ref 150–440)
RBC: 3.77 MIL/uL — ABNORMAL LOW (ref 3.80–5.20)
RDW: 16.2 % — AB (ref 11.5–14.5)
WBC: 9.5 10*3/uL (ref 3.6–11.0)

## 2016-09-26 LAB — COMPREHENSIVE METABOLIC PANEL
ALBUMIN: 3.8 g/dL (ref 3.5–5.0)
ALK PHOS: 66 U/L (ref 38–126)
ALT: 52 U/L (ref 14–54)
AST: 49 U/L — ABNORMAL HIGH (ref 15–41)
Anion gap: 7 (ref 5–15)
BILIRUBIN TOTAL: 0.4 mg/dL (ref 0.3–1.2)
BUN: 13 mg/dL (ref 6–20)
CALCIUM: 9 mg/dL (ref 8.9–10.3)
CO2: 25 mmol/L (ref 22–32)
CREATININE: 0.52 mg/dL (ref 0.44–1.00)
Chloride: 105 mmol/L (ref 101–111)
GFR calc Af Amer: >60 mL/min (ref >60)
GFR calc non Af Amer: >60 mL/min (ref >60)
GLUCOSE: 104 mg/dL — AB (ref 65–99)
Potassium: 3.6 mmol/L (ref 3.5–5.1)
SODIUM: 137 mmol/L (ref 135–145)
TOTAL PROTEIN: 7.2 g/dL (ref 6.5–8.1)

## 2016-09-26 LAB — APTT: APTT: 29 s (ref 24–36)

## 2016-09-26 LAB — PROTIME-INR
INR: 0.96
Prothrombin Time: 12.7 seconds (ref 11.4–15.2)

## 2016-09-28 ENCOUNTER — Ambulatory Visit
Admission: RE | Admit: 2016-09-28 | Discharge: 2016-09-28 | Disposition: A | Discharge status: Home / Self Care | Payer: Medicaid Other | Source: Ambulatory Visit | Attending: Surgery | Admitting: Surgery

## 2016-09-28 DIAGNOSIS — R1084 Generalized abdominal pain: Secondary | ICD-10-CM | POA: Diagnosis present

## 2016-09-28 DIAGNOSIS — K429 Umbilical hernia without obstruction or gangrene: Secondary | ICD-10-CM | POA: Insufficient documentation

## 2016-09-28 MED ORDER — IOPAMIDOL (ISOVUE-370) INJECTION 76%
100.0000 mL | Freq: Once | INTRAVENOUS | Status: AC | PRN
  Administered 2016-09-28: 100 mL via INTRAVENOUS

## 2016-10-02 ENCOUNTER — Telehealth: Payer: Self-pay

## 2016-10-02 NOTE — Telephone Encounter (Signed)
Reviewed results with Dr. Pabon. LFT's areEverlene Farriermproving and CT only showed a small Peri-umbilical Hernia. He would like for patient to keep appointment as scheduled on Thursday at 1000am.   Call made to patient. I reviewed results above with patient and she verbalizes understanding of these results. Patient has appointment at [facility] to see PCP on 10/04/16. She will call if she cannot keep appointment on 10/05/16.

## 2016-10-05 ENCOUNTER — Encounter: Payer: Self-pay | Admitting: Surgery

## 2016-10-05 ENCOUNTER — Ambulatory Visit (INDEPENDENT_AMBULATORY_CARE_PROVIDER_SITE_OTHER): Payer: Medicaid Other | Admitting: Surgery

## 2016-10-05 VITALS — BP 145/99 | HR 92 | Temp 98.4°F | Ht 68.0 in | Wt 287.2 lb

## 2016-10-05 DIAGNOSIS — L732 Hidradenitis suppurativa: Secondary | ICD-10-CM | POA: Diagnosis not present

## 2016-10-05 NOTE — Progress Notes (Signed)
Outpatient Surgical Follow Up  10/05/2016  Carmen Gomez is an 36 y.o. female.   Chief Complaint  Patient presents with  . Follow-up     Follow-up: Abdominal Pain (Review CT and Labs)    HPI: Ileus following up after I saw her a few weeks ago. She continues to have intermittent right upper quadrant pain worsening with heavy meals. She reports that the pain is dull and is located in the right upper quadrant she also has periumbilical pain. A CT scan was performed and a half personally reviewed. There is evidence of reducible umbilical hernia. No acute abnormalities. She did have an ultrasound showing multiple stones and normal common bile duct. Repeat labs including INR and LFT were normal. She is able to perform more than 4 Mets of activity without any shortness of breath or chest pain.  Past Medical History:  Diagnosis Date  . Asthma   . Diplopia   . Morbid obesity with BMI of 45.0-49.9, adult [facility])     Past Surgical History:  Procedure Laterality Date  . EYE SURGERY      Family History  Problem Relation Age of Onset  . Stroke Sister 3  . Diabetes Maternal Grandmother     Social History:  reports that she has been smoking Cigarettes.  She has been smoking about 0.50 packs per day. She has never used smokeless tobacco. She reports that she drinks alcohol. She reports that she uses drugs, including Marijuana.  Allergies:  Allergies  Allergen Reactions  . Bee Venom Swelling  . Aspirin Nausea And Vomiting    Medications reviewed.    ROS Full ROS performed and is otherwise negative other than what is stated in HPI   BP (!) 145/99   Pulse 92   Temp 98.4 F (36.9 C) (Oral)   Ht  (1.727 m)   Wt 130.3 kg (287 lb 3.2 oz)   BMI 43.67 kg/m   Physical Exam  Constitutional: She is oriented to person, place, and time and well-developed, well-nourished, and in no distress. No distress.  Obese  Eyes: Right eye exhibits no discharge. Left eye exhibits no  discharge. No scleral icterus.  Neck: Normal range of motion. Neck supple. No JVD present. No tracheal deviation present. No thyromegaly present.  Cardiovascular: Normal rate and intact distal pulses.   Pulmonary/Chest: Effort normal. No respiratory distress. She exhibits no tenderness.  Abdominal: Soft. She exhibits no distension and no mass. There is no rebound and no guarding.  Reducible UH, mild tenderness to palpation. No peritonitis  Neurological: She is alert and oriented to person, place, and time. Gait normal. GCS score is 15.  Skin: Skin is warm and dry. She is not diaphoretic.  Psychiatric: Mood, memory, affect and judgment normal.  Nursing note and vitals reviewed.    Assessment/Plan: 36 year old obese female with symptomatic cholelithiasis. She does have a history of alcohol abuse and I have encouraged her to quit drinking. She is committed to do so. Discussed with the patient in detail I do recommend laparoscopic cholecystectomy. There is no evidence of cirrhosis and her LFTs are normal as well as her INR and albumin. There is no ascites on the CT scan. The risks, benefits, complications, treatment options, and expected outcomes were discussed with the patient. The possibilities of bleeding, recurrent infection, finding a normal gallbladder, perforation of viscus organs, damage to surrounding structures, bile leak, abscess formation, needing a drain placed, the need for additional procedures, reaction to medication, pulmonary aspiration,  failure  to diagnose a condition, the possible need to convert to an open procedure, and creating a complication requiring transfusion or operation were discussed with the patient. The patient and/or family concurred with the proposed plan, giving informed consent.  We'll perform laparoscopic cholecystectomy and umbilical hernia repair in the same setting.    Sterling Big, MD [facility] General Surgeon

## 2016-10-05 NOTE — Patient Instructions (Addendum)
Prior to having your surgery we advised that you cut back on drinking and try to remain as sober as possible. This will help with the healing process after your surgery.   You have requested to have your gallbladder removed. This will be done on 10/25/16 at [facility] with Dr. Everlene Farrier.  You will most likely be out of work 1-2 weeks for this surgery. You will return after your post-op appointment with a lifting restriction for approximately 4 more weeks.  You will be able to eat anything you would like to following surgery. But, start by eating a bland diet and advance this as tolerated. The Gallbladder diet is below, please go as closely by this diet as possible prior to surgery to avoid any further attacks.  Please see the (blue)pre-care form that you have been given today. If you have any questions, please call our office.  Laparoscopic Cholecystectomy Laparoscopic cholecystectomy is surgery to remove the gallbladder. The gallbladder is located in the upper right part of the abdomen, behind the liver. It is a storage sac for bile, which is produced in the liver. Bile aids in the digestion and absorption of fats. Cholecystectomy is often done for inflammation of the gallbladder (cholecystitis). This condition is usually caused by a buildup of gallstones (cholelithiasis) in the gallbladder. Gallstones can block the flow of bile, and that can result in inflammation and pain. In severe cases, emergency surgery may be required. If emergency surgery is not required, you will have time to prepare for the procedure. Laparoscopic surgery is an alternative to open surgery. Laparoscopic surgery has a shorter recovery time. Your common bile duct may also need to be examined during the procedure. If stones are found in the common bile duct, they may be removed. LET [facility] CARE PROVIDER KNOW ABOUT:  Any allergies you have.  All medicines you are taking, including vitamins, herbs, eye drops, creams,  and over-the-counter medicines.  Previous problems you or members of your family have had with the use of anesthetics.  Any blood disorders you have.  Previous surgeries you have had.    Any medical conditions you have. RISKS AND COMPLICATIONS Generally, this is a safe procedure. However, problems may occur, including:  Infection.  Bleeding.  Allergic reactions to medicines.  Damage to other structures or organs.  A stone remaining in the common bile duct.  A bile leak from the cyst duct that is clipped when your gallbladder is removed.  The need to convert to open surgery, which requires a larger incision in the abdomen. This may be necessary if your surgeon thinks that it is not safe to continue with a laparoscopic procedure. BEFORE THE PROCEDURE  Ask your health care provider about:  Changing or stopping your regular medicines. This is especially important if you are taking diabetes medicines or blood thinners.  Taking medicines such as aspirin and ibuprofen. These medicines can thin your blood. Do not take these medicines before your procedure if your health care provider instructs you not to.  Follow instructions from your health care provider about eating or drinking restrictions.  Let your health care provider know if you develop a cold or an infection before surgery.  Plan to have someone take you home after the procedure.  Ask your health care provider how your surgical site will be marked or identified.  You may be given antibiotic medicine to help prevent infection. PROCEDURE  To reduce your risk of infection:  Your health care team will wash  or sanitize their hands.  Your skin will be washed with soap.  An IV tube may be inserted into one of your veins.  You will be given a medicine to make you fall asleep (general anesthetic).  A breathing tube will be placed in your mouth.  The surgeon will make several small cuts (incisions) in your  abdomen.  A thin, lighted tube (laparoscope) that has a tiny camera on the end will be inserted through one of the small incisions. The camera on the laparoscope will send a picture to a TV screen (monitor) in the operating room. This will give the surgeon a good view inside your abdomen.  A gas will be pumped into your abdomen. This will expand your abdomen to give the surgeon more room to perform the surgery.  Other tools that are needed for the procedure will be inserted through the other incisions. The gallbladder will be removed through one of the incisions.  After your gallbladder has been removed, the incisions will be closed with stitches (sutures), staples, or skin glue.  Your incisions may be covered with a bandage (dressing). The procedure may vary among health care providers and hospitals. AFTER THE PROCEDURE  Your blood pressure, heart rate, breathing rate, and blood oxygen level will be monitored often until the medicines you were given have worn off.  You will be given medicines as needed to control your pain.   This information is not intended to replace advice given to you by your health care provider. Make sure you discuss any questions you have with your health care provider.   Document Released: 12/19/2004 Document Revised: 09/09/2014 Document Reviewed: 07/31/2012 Elsevier Interactive Patient Education 2016 Elsevier Inc.   Low-Fat Diet for Gallbladder Conditions A low-fat diet can be helpful if you have pancreatitis or a gallbladder condition. With these conditions, your pancreas and gallbladder have trouble digesting fats. A healthy eating plan with less fat will help rest your pancreas and gallbladder and reduce your symptoms. WHAT DO I NEED TO KNOW ABOUT THIS DIET?  Eat a low-fat diet.  Reduce your fat intake to less than 20-30% of your total daily calories. This is less than 50-60 g of fat per day.  Remember that you need some fat in your diet. Ask your  dietician what your daily goal should be.  Choose nonfat and low-fat healthy foods. Look for the words "nonfat," "low fat," or "fat free."  As a guide, look on the label and choose foods with less than 3 g of fat per serving. Eat only one serving.  Avoid alcohol.  Do not smoke. If you need help quitting, talk with your health care provider.  Eat small frequent meals instead of three large heavy meals. WHAT FOODS CAN I EAT? Grains Include healthy grains and starches such as potatoes, wheat bread, fiber-rich cereal, and brown rice. Choose whole grain options whenever possible. In adults, whole grains should account for 45-65% of your daily calories.  Fruits and Vegetables Eat plenty of fruits and vegetables. Fresh fruits and vegetables add fiber to your diet. Meats and Other Protein Sources Eat lean meat such as chicken and pork. Trim any fat off of meat before cooking it. Eggs, fish, and beans are other sources of protein. In adults, these foods should account for 10-35% of your daily calories. Dairy Choose low-fat milk and dairy options. Dairy includes fat and protein, as well as calcium.  Fats and Oils Limit high-fat foods such as fried foods, sweets, baked goods,  sugary drinks.  Other Creamy sauces and condiments, such as mayonnaise, can add extra fat. Think about whether or not you need to use them, or use smaller amounts or low fat options. WHAT FOODS ARE NOT RECOMMENDED?  High fat foods, such as:  [employer].  Ice cream.  [place] toast.  Sweet rolls.  Pizza.  Cheese bread.  Foods covered with batter, butter, creamy sauces, or cheese.  Fried foods.  Sugary drinks and desserts.  Foods that cause gas or bloating   This information is not intended to replace advice given to you by your health care provider. Make sure you discuss any questions you have with your health care provider.   Document Released: 12/24/2012 Document Reviewed: 12/24/2012 Elsevier Interactive  Patient Education [employer]    You have requested for your Umbilical Hernia be repaired. This has been scheduled on 10/25/16 by Dr. Everlene Farrier at [facility].  Please see your (blue)pre-care sheet for information.  You will need to arrange to be off work for 1-2 weeks but will have to have a lifting restriction of no more than 15 lbs for 6 weeks following your surgery.   Umbilical Hernia, Adult A hernia is a bulge of tissue that pushes through an opening between muscles. An umbilical hernia happens in the abdomen, near the belly button (umbilicus). The hernia may contain tissues from the small intestine, large intestine, or fatty tissue covering the intestines (omentum). Umbilical hernias in adults tend to get worse over time, and they require surgical treatment. There are several types of umbilical hernias. You may have:  A hernia located just above or below the umbilicus (indirect hernia). This is the most common type of umbilical hernia in adults.  A hernia that forms through an opening formed by the umbilicus (direct hernia).  A hernia that comes and goes (reducible hernia). A reducible hernia may be visible only when you strain, lift something heavy, or cough. This type of hernia can be pushed back into the abdomen (reduced).  A hernia that traps abdominal tissue inside the hernia (incarcerated hernia). This type of hernia cannot be reduced.  A hernia that cuts off blood flow to the tissues inside the hernia (strangulated hernia). The tissues can start to die if this happens. This type of hernia requires emergency treatment.  What are the causes? An umbilical hernia happens when tissue inside the abdomen presses on a weak area of the abdominal muscles. What increases the risk? You may have a greater risk of this condition if you:  Are obese.  Have had several pregnancies.  Have a buildup of fluid inside your abdomen (ascites).  Have had surgery that weakens the  abdominal muscles.  What are the signs or symptoms? The main symptom of this condition is a painless bulge at or near the belly button. A reducible hernia may be visible only when you strain, lift something heavy, or cough. Other symptoms may include:  Dull pain.  A feeling of pressure.  Symptoms of a strangulated hernia may include:  Pain that gets increasingly worse.  Nausea and vomiting.  Pain when pressing on the hernia.  Skin over the hernia becoming red or purple.  Constipation.  Blood in the stool.  How is this diagnosed? This condition may be diagnosed based on:  A physical exam. You may be asked to cough or strain while standing. These actions increase the pressure inside your abdomen and force the hernia through the opening in your muscles. Your health care  provider may try to reduce the hernia by pressing on it.  Your symptoms and medical history.  How is this treated? Surgery is the only treatment for an umbilical hernia. Surgery for a strangulated hernia is done as soon as possible. If you have a small hernia that is not incarcerated, you may need to lose weight before having surgery. Follow these instructions at home:  Lose weight, if told by your health care provider.  Do not try to push the hernia back in.  Watch your hernia for any changes in color or size. Tell your health care provider if any changes occur.  You may need to avoid activities that increase pressure on your hernia.  Do not lift anything that is heavier than 10 lb (4.5 kg) until your health care provider says that this is safe.  Take over-the-counter and prescription medicines only as told by your health care provider.  Keep all follow-up visits as told by your health care provider. This is important. Contact a health care provider if:  Your hernia gets larger.  Your hernia becomes painful. Get help right away if:  You develop sudden, severe pain near the area of your  hernia.  You have pain as well as nausea or vomiting.  You have pain and the skin over your hernia changes color.  You develop a fever. This information is not intended to replace advice given to you by your health care provider. Make sure you discuss any questions you have with your health care provider. Document Released: 05/21/2015 Document Revised: 08/22/2015 Document Reviewed: 05/21/2015 Elsevier Interactive Patient Education  [employer].

## 2016-10-06 NOTE — Telephone Encounter (Signed)
Pt advised of pre op date/time and sx date. Sx: 10/25/16 with Dr Pabon-Laparoscopic cholecystectomy with umbilical hernia repair.  Pre op: 10/17 between 9-1:00pm--Phone.   Patient made aware to call [phone], between 1-3:00pm the day before surgery, to find out what time to arrive.

## 2016-10-18 ENCOUNTER — Inpatient Hospital Stay: Admission: RE | Admit: 2016-10-18 | Payer: Medicaid Other | Source: Ambulatory Visit

## 2016-10-19 ENCOUNTER — Inpatient Hospital Stay: Admission: RE | Admit: 2016-10-19 | Payer: Medicaid Other | Source: Ambulatory Visit

## 2016-10-20 ENCOUNTER — Encounter
Admission: RE | Admit: 2016-10-20 | Discharge: 2016-10-20 | Disposition: A | Discharge status: Home / Self Care | Payer: Medicaid Other | Source: Ambulatory Visit | Attending: Surgery | Admitting: Surgery

## 2016-10-20 HISTORY — DX: Gastro-esophageal reflux disease without esophagitis: K21.9

## 2016-10-20 HISTORY — DX: Depression, unspecified: F32.A

## 2016-10-20 HISTORY — DX: Major depressive disorder, single episode, unspecified: F32.9

## 2016-10-20 HISTORY — DX: Blindness, one eye, unspecified eye: H54.40

## 2016-10-20 NOTE — Patient Instructions (Signed)
  Your procedure is scheduled on: 10-25-16  Report to Same Day Surgery 2nd floor medical mall [facility](Medical Mall Entrance-take elevator on left to 2nd floor.  Check in with surgery information desk.) @ 1PM-PT NOTIFIED OF TIME ALREADY DUE TO TAKING TRANSPORTATION   Remember: Instructions that are not followed completely may result in serious medical risk, up to and including death, or upon the discretion of your surgeon and anesthesiologist your surgery may need to be rescheduled.    _x___ 1. Do not eat food after midnight the night before your procedure. You may drink clear liquids up to 2 hours before you are scheduled to arrive at the hospital for your procedure.  Do not drink clear liquids within 2 hours of your scheduled arrival to the hospital.  Clear liquids include  --Water or Apple juice without pulp  --Clear carbohydrate beverage such as ClearFast or Gatorade  --Black Coffee or Clear Tea (No milk, no creamers, do not add anything to the coffee or Tea  Type 1 and type 2 diabetics should only drink water.   No gum chewing or hard candies.     __x__ 2. No Alcohol for 24 hours before or after surgery.   __x__3. No Smoking for 24 prior to surgery.   ____  4. Bring all medications with you on the day of surgery if instructed.    __x__ 5. Notify your doctor if there is any change in your medical condition     (cold, fever, infections).     Do not wear jewelry, make-up, hairpins, clips or nail polish.  Do not wear lotions, powders, or perfumes. You may wear deodorant.  Do not shave 48 hours prior to surgery. Men may shave face and neck.  Do not bring valuables to the hospital.    [facility] is not [facility] for any belongings or valuables.               Contacts, dentures or bridgework may not be worn into surgery.  Leave your suitcase in the car. After surgery it may be brought to your room.  For patients admitted to the hospital, discharge time is determined by your  treatment  team.   Patients discharged the day of surgery will not be allowed to drive home.  You will need someone to drive you home and stay with you the night of your procedure.    Please read over the following fact sheets that you were given:     ____ Take anti-hypertensive listed below, cardiac, seizure, asthma, anti-reflux and psychiatric medicines. These include:  1. NONE  2.  3.  4.  5.  6.  ____Fleets enema or Magnesium Citrate as directed.   ____ Use CHG Soap or sage wipes as directed on instruction sheet   ____ Use inhalers on the day of surgery and bring to hospital day of surgery  ____ Stop Metformin and Janumet 2 days prior to surgery.    ____ Take 1/2 of usual insulin dose the night before surgery and none on the morning  surgery.   ____ Follow recommendations from Cardiologist, Pulmonologist or PCP regarding stopping Aspirin, Coumadin, Plavix ,Eliquis, Effient, or Pradaxa, and Pletal.  X____Stop Anti-inflammatories such as Advil, Aleve, Ibuprofen, Motrin, Naproxen, Naprosyn, Goodies powders or aspirin products NOW- OK to take Tylenol    ____ Stop supplements until after surgery.    ____ Bring C-Pap to the hospital.

## 2016-10-24 MED ORDER — DEXTROSE 5 % IV SOLN
3.0000 g | INTRAVENOUS | Status: AC
  Administered 2016-10-25: 3 g via INTRAVENOUS
  Filled 2016-10-24: qty 3000

## 2016-10-25 ENCOUNTER — Ambulatory Visit: Payer: Medicaid Other | Admitting: Anesthesiology

## 2016-10-25 ENCOUNTER — Encounter: Payer: Self-pay | Admitting: Anesthesiology

## 2016-10-25 ENCOUNTER — Encounter
Admission: RE | Disposition: A | Discharge status: Home / Self Care | Payer: Self-pay | Source: Ambulatory Visit | Attending: Surgery

## 2016-10-25 ENCOUNTER — Ambulatory Visit
Admission: RE | Admit: 2016-10-25 | Discharge: 2016-10-25 | Disposition: A | Discharge status: Home / Self Care | Payer: Medicaid Other | Source: Ambulatory Visit | Attending: Surgery | Admitting: Surgery

## 2016-10-25 DIAGNOSIS — F17210 Nicotine dependence, cigarettes, uncomplicated: Secondary | ICD-10-CM | POA: Diagnosis not present

## 2016-10-25 DIAGNOSIS — K8010 Calculus of gallbladder with chronic cholecystitis without obstruction: Secondary | ICD-10-CM

## 2016-10-25 DIAGNOSIS — Z6841 Body Mass Index (BMI) 40.0 and over, adult: Secondary | ICD-10-CM | POA: Diagnosis not present

## 2016-10-25 DIAGNOSIS — K8020 Calculus of gallbladder without cholecystitis without obstruction: Secondary | ICD-10-CM

## 2016-10-25 DIAGNOSIS — K8070 Calculus of gallbladder and bile duct without cholecystitis without obstruction: Secondary | ICD-10-CM | POA: Insufficient documentation

## 2016-10-25 DIAGNOSIS — J45909 Unspecified asthma, uncomplicated: Secondary | ICD-10-CM | POA: Diagnosis not present

## 2016-10-25 DIAGNOSIS — K429 Umbilical hernia without obstruction or gangrene: Secondary | ICD-10-CM | POA: Insufficient documentation

## 2016-10-25 HISTORY — PX: UMBILICAL HERNIA REPAIR: SHX196

## 2016-10-25 HISTORY — PX: CHOLECYSTECTOMY: SHX55

## 2016-10-25 LAB — URINE DRUG SCREEN, QUALITATIVE (ARMC ONLY)
AMPHETAMINES, UR SCREEN: NOT DETECTED
BARBITURATES, UR SCREEN: NOT DETECTED
BENZODIAZEPINE, UR SCRN: NOT DETECTED
Cannabinoid 50 Ng, Ur ~~LOC~~: POSITIVE — AB
Cocaine Metabolite,Ur ~~LOC~~: NOT DETECTED
MDMA (Ecstasy)Ur Screen: NOT DETECTED
METHADONE SCREEN, URINE: NOT DETECTED
OPIATE, UR SCREEN: NOT DETECTED
PHENCYCLIDINE (PCP) UR S: NOT DETECTED
Tricyclic, Ur Screen: NOT DETECTED

## 2016-10-25 LAB — POCT PREGNANCY, URINE: PREG TEST UR: NEGATIVE

## 2016-10-25 SURGERY — LAPAROSCOPIC CHOLECYSTECTOMY
Anesthesia: General | Site: Abdomen | Wound class: Clean Contaminated

## 2016-10-25 MED ORDER — FENTANYL CITRATE (PF) 100 MCG/2ML IJ SOLN
INTRAMUSCULAR | Status: AC
  Administered 2016-10-25: 25 ug via INTRAVENOUS
  Filled 2016-10-25: qty 2

## 2016-10-25 MED ORDER — FAMOTIDINE 20 MG PO TABS
ORAL_TABLET | ORAL | Status: AC
  Administered 2016-10-25: 20 mg via ORAL
  Filled 2016-10-25: qty 1

## 2016-10-25 MED ORDER — CHLORHEXIDINE GLUCONATE CLOTH 2 % EX PADS: 6.0000 | MEDICATED_PAD | Freq: Once | CUTANEOUS | Status: DC

## 2016-10-25 MED ORDER — ONDANSETRON HCL 4 MG/2ML IJ SOLN: 4.0000 mg | Freq: Once | INTRAMUSCULAR | Status: DC | PRN

## 2016-10-25 MED ORDER — FENTANYL CITRATE (PF) 100 MCG/2ML IJ SOLN
INTRAMUSCULAR | Status: AC
Start: 2016-10-25 — End: 2016-10-25
  Filled 2016-10-25: qty 2

## 2016-10-25 MED ORDER — FENTANYL CITRATE (PF) 100 MCG/2ML IJ SOLN
INTRAMUSCULAR | Status: AC
  Filled 2016-10-25: qty 2

## 2016-10-25 MED ORDER — PROPOFOL 10 MG/ML IV BOLUS
INTRAVENOUS | Status: AC
  Filled 2016-10-25: qty 20

## 2016-10-25 MED ORDER — LIDOCAINE HCL (PF) 2 % IJ SOLN
INTRAMUSCULAR | Status: AC
  Filled 2016-10-25: qty 10

## 2016-10-25 MED ORDER — ACETAMINOPHEN 10 MG/ML IV SOLN
INTRAVENOUS | Status: AC
  Filled 2016-10-25: qty 100

## 2016-10-25 MED ORDER — PHENYLEPHRINE HCL 10 MG/ML IJ SOLN
INTRAMUSCULAR | Status: DC | PRN
  Administered 2016-10-25: 100 ug via INTRAVENOUS

## 2016-10-25 MED ORDER — SUGAMMADEX SODIUM 500 MG/5ML IV SOLN
INTRAVENOUS | Status: AC
  Filled 2016-10-25: qty 5

## 2016-10-25 MED ORDER — KETOROLAC TROMETHAMINE 30 MG/ML IJ SOLN
INTRAMUSCULAR | Status: DC | PRN
  Administered 2016-10-25: 30 mg via INTRAVENOUS

## 2016-10-25 MED ORDER — FENTANYL CITRATE (PF) 100 MCG/2ML IJ SOLN
25.0000 ug | INTRAMUSCULAR | Status: AC | PRN
  Administered 2016-10-25 (×2): 25 ug via INTRAVENOUS

## 2016-10-25 MED ORDER — FAMOTIDINE 20 MG PO TABS
20.0000 mg | ORAL_TABLET | Freq: Once | ORAL | Status: AC
  Administered 2016-10-25: 20 mg via ORAL

## 2016-10-25 MED ORDER — DEXAMETHASONE SODIUM PHOSPHATE 10 MG/ML IJ SOLN
INTRAMUSCULAR | Status: DC | PRN
  Administered 2016-10-25: 10 mg via INTRAVENOUS

## 2016-10-25 MED ORDER — BUPIVACAINE-EPINEPHRINE (PF) 0.25% -1:200000 IJ SOLN
INTRAMUSCULAR | Status: AC
  Filled 2016-10-25: qty 30

## 2016-10-25 MED ORDER — FENTANYL CITRATE (PF) 100 MCG/2ML IJ SOLN
25.0000 ug | INTRAMUSCULAR | Status: AC | PRN
  Administered 2016-10-25 (×6): 25 ug via INTRAVENOUS

## 2016-10-25 MED ORDER — BUPIVACAINE-EPINEPHRINE 0.25% -1:200000 IJ SOLN
INTRAMUSCULAR | Status: DC | PRN
  Administered 2016-10-25: 30 mL

## 2016-10-25 MED ORDER — FENTANYL CITRATE (PF) 100 MCG/2ML IJ SOLN
INTRAMUSCULAR | Status: DC | PRN
  Administered 2016-10-25: 100 ug via INTRAVENOUS

## 2016-10-25 MED ORDER — LIDOCAINE HCL (CARDIAC) 20 MG/ML IV SOLN
INTRAVENOUS | Status: DC | PRN
  Administered 2016-10-25: 80 mg via INTRAVENOUS

## 2016-10-25 MED ORDER — ONDANSETRON HCL 4 MG/2ML IJ SOLN
INTRAMUSCULAR | Status: DC | PRN
  Administered 2016-10-25: 4 mg via INTRAVENOUS

## 2016-10-25 MED ORDER — HYDROCODONE-ACETAMINOPHEN 5-325 MG PO TABS
1.0000 | ORAL_TABLET | Freq: Four times a day (QID) | ORAL | Status: DC | PRN
  Administered 2016-10-25: 1 via ORAL

## 2016-10-25 MED ORDER — MIDAZOLAM HCL 2 MG/2ML IJ SOLN
INTRAMUSCULAR | Status: DC | PRN
  Administered 2016-10-25: 2 mg via INTRAVENOUS

## 2016-10-25 MED ORDER — ROCURONIUM BROMIDE 100 MG/10ML IV SOLN
INTRAVENOUS | Status: DC | PRN
  Administered 2016-10-25: 50 mg via INTRAVENOUS

## 2016-10-25 MED ORDER — MIDAZOLAM HCL 2 MG/2ML IJ SOLN
INTRAMUSCULAR | Status: AC
  Filled 2016-10-25: qty 2

## 2016-10-25 MED ORDER — HYDROCODONE-ACETAMINOPHEN 5-325 MG PO TABS: 1.0000 | ORAL_TABLET | Freq: Four times a day (QID) | ORAL | 0 refills | Status: DC | PRN

## 2016-10-25 MED ORDER — PROPOFOL 10 MG/ML IV BOLUS
INTRAVENOUS | Status: DC | PRN
  Administered 2016-10-25: 180 mg via INTRAVENOUS

## 2016-10-25 MED ORDER — ROCURONIUM BROMIDE 50 MG/5ML IV SOLN
INTRAVENOUS | Status: AC
  Filled 2016-10-25: qty 1

## 2016-10-25 MED ORDER — SUGAMMADEX SODIUM 500 MG/5ML IV SOLN
INTRAVENOUS | Status: DC | PRN
  Administered 2016-10-25: 300 mg via INTRAVENOUS

## 2016-10-25 MED ORDER — HYDROCODONE-ACETAMINOPHEN 5-325 MG PO TABS
ORAL_TABLET | ORAL | Status: AC
  Filled 2016-10-25: qty 1

## 2016-10-25 MED ORDER — ACETAMINOPHEN 10 MG/ML IV SOLN
INTRAVENOUS | Status: DC | PRN
  Administered 2016-10-25: 1000 mg via INTRAVENOUS

## 2016-10-25 MED ORDER — LACTATED RINGERS IV SOLN
INTRAVENOUS | Status: DC
  Administered 2016-10-25 (×2): via INTRAVENOUS

## 2016-10-25 SURGICAL SUPPLY — 56 items
APPLICATOR COTTON TIP 6IN STRL (MISCELLANEOUS) ×3 IMPLANT
APPLIER CLIP 11 MED OPEN (CLIP) ×3
APPLIER CLIP 5 13 M/L LIGAMAX5 (MISCELLANEOUS) ×3
BLADE CLIPPER SURG (BLADE) ×3 IMPLANT
BLADE SURG 15 STRL LF DISP TIS (BLADE) ×1 IMPLANT
BLADE SURG 15 STRL SS (BLADE) ×2
CANISTER SUCT 1200ML W/VALVE (MISCELLANEOUS) ×3 IMPLANT
CHLORAPREP W/TINT 26ML (MISCELLANEOUS) ×3 IMPLANT
CHOLANGIOGRAM CATH TAUT (CATHETERS) IMPLANT
CLEANER CAUTERY TIP 5X5 PAD (MISCELLANEOUS) ×1 IMPLANT
CLIP APPLIE 11 MED OPEN (CLIP) ×1 IMPLANT
CLIP APPLIE 5 13 M/L LIGAMAX5 (MISCELLANEOUS) ×1 IMPLANT
DECANTER SPIKE VIAL GLASS SM (MISCELLANEOUS) ×6 IMPLANT
DERMABOND ADVANCED (GAUZE/BANDAGES/DRESSINGS) ×2
DERMABOND ADVANCED .7 DNX12 (GAUZE/BANDAGES/DRESSINGS) ×1 IMPLANT
DRAPE C-ARM XRAY 36X54 (DRAPES) ×3 IMPLANT
DRAPE INCISE IOBAN 66X45 STRL (DRAPES) ×3 IMPLANT
DRAPE PED LAPAROTOMY (DRAPES) ×3 IMPLANT
DRSG TELFA 3X8 NADH (GAUZE/BANDAGES/DRESSINGS) ×3 IMPLANT
ELECT CAUTERY BLADE 6.4 (BLADE) ×3 IMPLANT
ELECT REM PT RETURN 9FT ADLT (ELECTROSURGICAL) ×3
ELECTRODE REM PT RTRN 9FT ADLT (ELECTROSURGICAL) ×1 IMPLANT
GLOVE BIO SURGEON STRL SZ7 (GLOVE) ×6 IMPLANT
GOWN STRL REUS W/ TWL LRG LVL3 (GOWN DISPOSABLE) ×3 IMPLANT
GOWN STRL REUS W/TWL LRG LVL3 (GOWN DISPOSABLE) ×6
IRRIGATION STRYKERFLOW (MISCELLANEOUS) ×1 IMPLANT
IRRIGATOR STRYKERFLOW (MISCELLANEOUS) ×3
IV CATH ANGIO 12GX3 LT BLUE (NEEDLE) ×3 IMPLANT
IV NS 1000ML (IV SOLUTION) ×2
IV NS 1000ML BAXH (IV SOLUTION) ×1 IMPLANT
L-HOOK LAP DISP 36CM (ELECTROSURGICAL) ×3
LHOOK LAP DISP 36CM (ELECTROSURGICAL) ×1 IMPLANT
NEEDLE HYPO 22GX1.5 SAFETY (NEEDLE) ×3 IMPLANT
NS IRRIG 500ML POUR BTL (IV SOLUTION) ×3 IMPLANT
PACK BASIN MINOR ARMC (MISCELLANEOUS) ×3 IMPLANT
PACK LAP CHOLECYSTECTOMY (MISCELLANEOUS) ×3 IMPLANT
PAD CLEANER CAUTERY TIP 5X5 (MISCELLANEOUS) ×2
PENCIL ELECTRO HAND CTR (MISCELLANEOUS) ×3 IMPLANT
POUCH SPECIMEN RETRIEVAL 10MM (ENDOMECHANICALS) ×3 IMPLANT
SCISSORS METZENBAUM CVD 33 (INSTRUMENTS) ×3 IMPLANT
SLEEVE ENDOPATH XCEL 5M (ENDOMECHANICALS) ×6 IMPLANT
SOL ANTI-FOG 6CC FOG-OUT (MISCELLANEOUS) ×1 IMPLANT
SOL FOG-OUT ANTI-FOG 6CC (MISCELLANEOUS) ×2
SPONGE LAP 18X18 5 PK (GAUZE/BANDAGES/DRESSINGS) ×3 IMPLANT
STOPCOCK 4 WAY LG BORE MALE ST (IV SETS) IMPLANT
SUT ETHIBOND 0 MO6 C/R (SUTURE) IMPLANT
SUT ETHIBOND NAB MO 7 #0 18IN (SUTURE) ×3 IMPLANT
SUT MNCRL AB 4-0 PS2 18 (SUTURE) ×3 IMPLANT
SUT VIC AB 3-0 SH 27 (SUTURE) ×2
SUT VIC AB 3-0 SH 27X BRD (SUTURE) ×1 IMPLANT
SUT VICRYL 0 AB UR-6 (SUTURE) ×6 IMPLANT
SYR 20CC LL (SYRINGE) ×3 IMPLANT
TROCAR XCEL BLUNT TIP 100MML (ENDOMECHANICALS) ×3 IMPLANT
TROCAR XCEL NON-BLD 5MMX100MML (ENDOMECHANICALS) ×3 IMPLANT
TUBING INSUFFLATOR HI FLOW (MISCELLANEOUS) ×3 IMPLANT
WATER STERILE IRR 1000ML POUR (IV SOLUTION) ×3 IMPLANT

## 2016-10-25 NOTE — Anesthesia Procedure Notes (Signed)
Procedure Name: Intubation Date/Time: 10/25/2016 4:09 PM Performed by: Hedda Slade Pre-anesthesia Checklist: Patient identified, Patient being monitored, Timeout performed, Emergency Drugs available and Suction available Patient Re-evaluated:Patient Re-evaluated prior to induction Oxygen Delivery Method: Circle system utilized Preoxygenation: Pre-oxygenation with 100% oxygen Induction Type: IV induction Ventilation: Mask ventilation without difficulty Laryngoscope Size: Mac and 3 Grade View: Grade I Tube type: Oral Tube size: 7.0 mm Number of attempts: 1 Airway Equipment and Method: Stylet Placement Confirmation: ETT inserted through vocal cords under direct vision,  positive ETCO2 and breath sounds checked- equal and bilateral Secured at: 21 cm Tube secured with: Tape Dental Injury: Teeth and Oropharynx as per pre-operative assessment

## 2016-10-25 NOTE — Anesthesia Preprocedure Evaluation (Signed)
Anesthesia Evaluation  Patient identified by MRN, date of birth, ID band Patient awake    Reviewed: Allergy & Precautions, NPO status , Patient's Chart, lab work & pertinent test results, reviewed documented beta blocker date and time   Airway Mallampati: III  TM Distance: >3 FB     Dental  (+) Chipped   Pulmonary asthma , Current Smoker,           Cardiovascular      Neuro/Psych PSYCHIATRIC DISORDERS Depression    GI/Hepatic GERD  Controlled,  Endo/Other  Morbid obesity  Renal/GU      Musculoskeletal   Abdominal   Peds  Hematology   Anesthesia Other Findings L eye blind.  Reproductive/Obstetrics                             Anesthesia Physical Anesthesia Plan  ASA: III  Anesthesia Plan: General   Post-op Pain Management:    Induction: Intravenous  PONV Risk Score and Plan:   Airway Management Planned: Oral ETT  Additional Equipment:   Intra-op Plan:   Post-operative Plan:   Informed Consent: I have reviewed the patients History and Physical, chart, labs and discussed the procedure including the risks, benefits and alternatives for the proposed anesthesia with the patient or authorized representative who has indicated his/her understanding and acceptance.     Plan Discussed with: CRNA  Anesthesia Plan Comments:         Anesthesia Quick Evaluation

## 2016-10-25 NOTE — Transfer of Care (Signed)
Immediate Anesthesia Transfer of Care Note  Patient: Carmen Gomez  Procedure(s) Performed: LAPAROSCOPIC CHOLECYSTECTOMY (N/A Abdomen) HERNIA REPAIR UMBILICAL ADULT (N/A Abdomen)  Patient Location: PACU  Anesthesia Type:General  Level of Consciousness: awake, alert  and oriented  Airway & Oxygen Therapy: Patient Spontanous Breathing and Patient connected to face mask oxygen  Post-op Assessment: Report given to RN and Post -op Vital signs reviewed and stable  Post vital signs: Reviewed and stable  Last Vitals:  Vitals:   10/25/16 1250 10/25/16 1723  BP: (!) 144/94 (!) 151/93  Pulse: 72 83  Resp: 18 17  Temp: 36.9 C (!) 36.1 C  SpO2: 100% 100%    Last Pain:  Vitals:   10/25/16 1250  TempSrc: Oral         Complications: No apparent anesthesia complications

## 2016-10-25 NOTE — Discharge Instructions (Signed)

## 2016-10-25 NOTE — Op Note (Addendum)
Laparoscopic Cholecystectomy  Pre-operative Diagnosis: Biliary colic, Umbilical hernia  Post-operative Diagnosis: Same  Procedure:  1. Laparoscopic cholecystectomy 2. Umbilical hernia repair 3. Partial excision of greater  omentum  Surgeon: Sterling Bigiego Pabon, MD FACS  Anesthesia: Gen. with endotracheal tube   Findings: Chronic Cholecystitis  [facility]  Estimated Blood Loss: 10cc         Drains: none         Specimens: Gallbladder           Complications: none   Procedure Details  The patient was seen again in the Holding Room. The benefits, complications, treatment options, and expected outcomes were discussed with the patient. The risks of bleeding, infection, recurrence of symptoms, failure to resolve symptoms, bile duct damage, bile duct leak, retained common bile duct stone, bowel injury, any of which could require further surgery and/or ERCP, stent, or papillotomy were reviewed with the patient. The likelihood of improving the patient's symptoms with return to their baseline status is good.  The patient and/or family concurred with the proposed plan, giving informed consent.  The patient was taken to Operating Room, identified as Sharen Counteranielle V Habermehl and the procedure verified as Laparoscopic Cholecystectomy.  A Time Out was held and the above information confirmed.  Prior to the induction of general anesthesia, antibiotic prophylaxis was administered. VTE prophylaxis was in place. General endotracheal anesthesia was then administered and tolerated well. After the induction, the abdomen was prepped with Chloraprep and draped in the sterile fashion. The patient was positioned in the supine position.  Supraumbilical incision created, hernia sac encountered and excised. There was a piece of greater omentum that was chronically incarcerated. It was removed w cautery.The fascial edges were cleaned and two stay sutures placed on the fascia. A Hasson trochar was placed and Pneumoperitoneum was   created with CO2 and tolerated well without any adverse changes in the patient's vital signs.  Three 5-mm ports were placed in the right upper quadrant all under direct vision. All skin incisions  were infiltrated with a local anesthetic agent before making the incision and placing the trocars.   The patient was positioned  in reverse Trendelenburg, tilted slightly to the patient's left.  The gallbladder was identified, the fundus grasped and retracted cephalad. Adhesions were lysed bluntly. The infundibulum was grasped and retracted laterally, exposing the peritoneum overlying the triangle of Calot. This was then divided and exposed in a blunt fashion. An extended critical view of the cystic duct and cystic artery was obtained.  The cystic duct was clearly identified and bluntly dissected.   Artery and duct were double clipped and divided.  The gallbladder was taken from the gallbladder fossa in a retrograde fashion with the electrocautery. The gallbladder was removed and placed in an Endocatch bag. The liver bed was irrigated and inspected. Hemostasis was achieved with the electrocautery. Copious irrigation was utilized and was repeatedly aspirated until clear.  The gallbladder and Endocatch sac were then removed through a port site.   Inspection of the right upper quadrant was performed. No bleeding, bile duct injury or leak, or bowel injury was noted. Pneumoperitoneum was released.    The umbilical defect was closed with interrumpted 0 Ethibond sutures, sub q approximated w 3-0 vycril. 4-0 subcuticular Monocryl was used to close the skin. Dermabond was  applied.  The patient was then extubated and brought to the recovery room in stable condition. Sponge, lap, and needle counts were correct at closure and at the conclusion of the case.  Caroleen Hamman, MD, FACS

## 2016-10-25 NOTE — Anesthesia Postprocedure Evaluation (Signed)
Anesthesia Post Note  Patient: Carmen Gomez  Procedure(s) Performed: LAPAROSCOPIC CHOLECYSTECTOMY (N/A Abdomen) HERNIA REPAIR UMBILICAL ADULT (N/A Abdomen)  Patient location during evaluation: PACU Anesthesia Type: General Level of consciousness: awake and alert Pain management: pain level controlled Vital Signs Assessment: post-procedure vital signs reviewed and stable Respiratory status: spontaneous breathing, nonlabored ventilation, respiratory function stable and patient connected to nasal cannula oxygen Cardiovascular status: blood pressure returned to baseline and stable Postop Assessment: no apparent nausea or vomiting Anesthetic complications: no     Last Vitals:  Vitals:   10/25/16 1819 10/25/16 1826  BP: (!) 147/93 (!) 157/88  Pulse: 86 78  Resp: 18 16  Temp: 36.6 C (!) 36.2 C  SpO2: 98% 100%    Last Pain:  Vitals:   10/25/16 1905  TempSrc:   PainSc: 3                  Cleda MccreedyJoseph K Piscitello

## 2016-10-25 NOTE — OR Nursing (Signed)
Patient had difficulty finding transportation home. I personally talked with Colin MuldersBrianna [phone]  [phone] and she states that she will  be coming to take her home. Patient understands that she also needs someone to stay with her 24 hours post-op.  Patient verbalizes understanding.

## 2016-10-25 NOTE — Interval H&P Note (Signed)
History and Physical Interval Note:  10/25/2016 3:23 PM  Carmen Gomez  has presented today for surgery, with the diagnosis of Cholelithiasis  The various methods of treatment have been discussed with the patient and family. After consideration of risks, benefits and other options for treatment, the patient has consented to  Procedure(s): LAPAROSCOPIC CHOLECYSTECTOMY (N/A) HERNIA REPAIR UMBILICAL ADULT (N/A) as a surgical intervention .  The patient's history has been reviewed, patient examined, no change in status, stable for surgery.  I have reviewed the patient's chart and labs.  Questions were answered to the patient's satisfaction.     Atiana Levier F Bowyn Mercier

## 2016-10-25 NOTE — Anesthesia Post-op Follow-up Note (Signed)
Anesthesia QCDR form completed.        

## 2016-10-25 NOTE — H&P (View-Only) (Signed)
Outpatient Surgical Follow Up  10/05/2016  Carmen Gomez is an 36 y.o. female.   Chief Complaint  Patient presents with  . Follow-up     Follow-up: Abdominal Pain (Review CT and Labs)    HPI: Ileus following up after I saw her a few weeks ago. She continues to have intermittent right upper quadrant pain worsening with heavy meals. She reports that the pain is dull and is located in the right upper quadrant she also has periumbilical pain. A CT scan was performed and a half personally reviewed. There is evidence of reducible umbilical hernia. No acute abnormalities. She did have an ultrasound showing multiple stones and normal common bile duct. Repeat labs including INR and LFT were normal. She is able to perform more than 4 Mets of activity without any shortness of breath or chest pain.  Past Medical History:  Diagnosis Date  . Asthma   . Diplopia   . Morbid obesity with BMI of 45.0-49.9, adult [facility](HCC)     Past Surgical History:  Procedure Laterality Date  . EYE SURGERY      Family History  Problem Relation Age of Onset  . Stroke Sister [id number]  . Diabetes Maternal Grandmother     Social History:  reports that she has been smoking Cigarettes.  She has been smoking about 0.50 packs per day. She has never used smokeless tobacco. She reports that she drinks alcohol. She reports that she uses drugs, including Marijuana.  Allergies:  Allergies  Allergen Reactions  . Bee Venom Swelling  . Aspirin Nausea And Vomiting    Medications reviewed.    ROS Full ROS performed and is otherwise negative other than what is stated in HPI   BP (!) 145/99   Pulse 92   Temp 98.4 F (36.9 C) (Oral)   Ht 5\' 8"  (1.727 m)   Wt 130.3 kg (287 lb 3.2 oz)   BMI 43.67 kg/m   Physical Exam  Constitutional: She is oriented to person, place, and time and well-developed, well-nourished, and in no distress. No distress.  Obese  Eyes: Right eye exhibits no discharge. Left eye exhibits no  discharge. No scleral icterus.  Neck: Normal range of motion. Neck supple. No JVD present. No tracheal deviation present. No thyromegaly present.  Cardiovascular: Normal rate and intact distal pulses.   Pulmonary/Chest: Effort normal. No respiratory distress. She exhibits no tenderness.  Abdominal: Soft. She exhibits no distension and no mass. There is no rebound and no guarding.  Reducible UH, mild tenderness to palpation. No peritonitis  Neurological: She is alert and oriented to person, place, and time. Gait normal. GCS score is 15.  Skin: Skin is warm and dry. She is not diaphoretic.  Psychiatric: Mood, memory, affect and judgment normal.  Nursing note and vitals reviewed.    Assessment/Plan: 36 year old obese female with symptomatic cholelithiasis. She does have a history of alcohol abuse and I have encouraged her to quit drinking. She is committed to do so. Discussed with the patient in detail I do recommend laparoscopic cholecystectomy. There is no evidence of cirrhosis and her LFTs are normal as well as her INR and albumin. There is no ascites on the CT scan. The risks, benefits, complications, treatment options, and expected outcomes were discussed with the patient. The possibilities of bleeding, recurrent infection, finding a normal gallbladder, perforation of viscus organs, damage to surrounding structures, bile leak, abscess formation, needing a drain placed, the need for additional procedures, reaction to medication, pulmonary aspiration,  failure  to diagnose a condition, the possible need to convert to an open procedure, and creating a complication requiring transfusion or operation were discussed with the patient. The patient and/or family concurred with the proposed plan, giving informed consent.  We'll perform laparoscopic cholecystectomy and umbilical hernia repair in the same setting.    Sterling Big, MD [facility] General Surgeon

## 2016-10-26 ENCOUNTER — Encounter: Payer: Self-pay | Admitting: Surgery

## 2016-10-27 LAB — SURGICAL PATHOLOGY

## 2016-11-09 ENCOUNTER — Ambulatory Visit (INDEPENDENT_AMBULATORY_CARE_PROVIDER_SITE_OTHER): Payer: Medicaid Other | Admitting: Surgery

## 2016-11-09 ENCOUNTER — Encounter: Payer: Self-pay | Admitting: Surgery

## 2016-11-09 VITALS — BP 150/98 | HR 105 | Temp 98.7°F | Ht 68.0 in | Wt 291.0 lb

## 2016-11-09 DIAGNOSIS — Z09 Encounter for follow-up examination after completed treatment for conditions other than malignant neoplasm: Secondary | ICD-10-CM

## 2016-11-09 NOTE — Progress Notes (Signed)
S/p lap chole UH repair Doing very well Very little pain Taking PO. No N/V , no fevers Path d/w pt  PE NAD Abd: soft, incisions c/d/i. No infection or recurrence  A/p Doing well No heavy lifting  RTC prn

## 2016-11-09 NOTE — Patient Instructions (Signed)

## 2016-11-28 ENCOUNTER — Telehealth: Payer: Self-pay | Admitting: General Practice

## 2016-11-28 NOTE — Telephone Encounter (Signed)
Patient called said she had surgery done on the October 24th with Dr. Everlene FarrierPabon she had a laparoscopic cholecystectomy/hernia repair  and had came in for her two week follow up on 11/09/16, patient is now calling complaining of bleeding heavy then will go and bleed a little and then back heavy again, patient is having pain in lower abdomen, pain level being a 8 at times but will go back down. Patient is asking is this normal symptoms. Please call patient and advice at this number [phone].

## 2016-11-28 NOTE — Telephone Encounter (Signed)
Returned call to patient at this time and left a message advising patient to give our office a call back regarding her concerns.

## 2016-11-29 NOTE — Telephone Encounter (Addendum)
Patient returned my call at this time and advised me that she is still having heavy to light bleeding. I asked if the bleeding was coming from her incisions she said no that she was referring to her menstral cycle. I advised her that she would have to see her OBGYN to resolve this problem. I asked if she was having any issues with her incision regarding the cholecystectomy/hernia repair? She stated that she is having sharp pain all over her abdomen as well as having diarrhea going about 5-6 time a day. Asked patient if she was having any drainage from the incisions and how was her appetite? She advised that she was having no drainage coming from the incisions.Having nausea before eating, after eating is when she is having the most pain, and that any type of food is causing her to have pain. I offered her an appointment to be seen today and tomorrow. She verbalized that she uses the transportation service and would be able to get here on Friday. I verbalized understanding and offered her an appointment for 11/30 with Dr. Everlene FarrierPabon. She verbalized understanding and confirmed appointment.

## 2016-12-01 ENCOUNTER — Ambulatory Visit (INDEPENDENT_AMBULATORY_CARE_PROVIDER_SITE_OTHER): Payer: Medicaid Other | Admitting: Surgery

## 2016-12-01 ENCOUNTER — Telehealth: Payer: Self-pay

## 2016-12-01 ENCOUNTER — Encounter: Payer: Self-pay | Admitting: Surgery

## 2016-12-01 DIAGNOSIS — R109 Unspecified abdominal pain: Secondary | ICD-10-CM

## 2016-12-01 MED ORDER — ONDANSETRON HCL 4 MG PO TABS: 4.0000 mg | ORAL_TABLET | Freq: Three times a day (TID) | ORAL | 0 refills | Status: DC | PRN

## 2016-12-01 NOTE — Progress Notes (Signed)
S/p lap chole and UH more than 1 month ago Now w diarrhea , nausea  heavy period and severe HTN ( no PCPC)  PE NAD AVSS Abd: soft, nt, incisions healed, no peritonitis or infection  A/p We will order RUQ U/S Needs to have PCP/ gyn CBC and CMP F/u after above is completed zofran for nausea No need for emergent surgical intervention

## 2016-12-01 NOTE — Patient Instructions (Signed)
We have scheduled you for an Ultrasound of your Abdomen. We will call you with the details of this appointment as soon as this is scheduled.  Bring a list of medications with you to your appointment.  Please do not have anything to eat or drink after midnight prior to your Ultrasound.   If you need to reschedule your Ultrasound, you may do so by calling [phone].   Abdominal Ultrasound An ultrasound is a test that uses sound waves to take pictures of the inside of the body. An abdominal ultrasound takes pictures of the inside of the abdomen, and a pelvic ultrasound takes pictures of the inside of the pelvis. An abdominal or pelvic ultrasound may be done:  To provide information about a baby developing in the womb, such as the baby's position and heartbeat.  To check the shape or size of an organ.  To check for problems such as: ? Cysts. ? Masses. ? Inflammation. ? Kidney stones. ? Gallstones.  Tell a health care provider about:  Any allergies you have.  All medicines you are taking, including vitamins, herbs, eye drops, creams, and over-the-counter medicines.  Previous surgeries you have had.  Any medical conditions you have.  Whether you are pregnant or may be pregnant. What are the risks? There are no known risks or complications from having this test. What happens before the procedure?  Follow instructions from your health care provider about eating or drinking restrictions.  Wear clothing that is easily washable in case the gel used for the test gets on your clothes. What happens during the procedure?  A gel will be applied to your skin. It may feel cool.  A wand-like device called a transducer will be placed on the area to be examined.  The transducer will take pictures. These will be displayed on one or more monitors that look like small TV screens. What happens after the procedure?  It is your responsibility to get your test results. Ask your health care  provider or the department performing the test when your results will be ready.  Keep follow-up visits as told by your health care provider. This is important. This information is not intended to replace advice given to you by your health care provider. Make sure you discuss any questions you have with your health care provider. Document Released: 12/17/1999 Document Revised: 09/01/2015 Document Reviewed: 09/11/2014 Elsevier Interactive Patient Education  [employer]2018 [employer]  We have ordered some labs to be drawn today. Please proceed to the Medical Mall to have these tests completed prior to leaving today. You will check in at the registration desk in the medical mall. Please see walking directions below if needed.  We will call you with the results and next step in plan of care as soon as results are received.   Directions to Medical Mall: When leaving our office, go right. Go all of the way down to the very end of the hallway. You will have a purple wall in front of you. You will now have a tunnel to the hospital on your left hand side. Go through this tunnel and the elevators will be on your left. Go down to the 1st floor and take a slight left. The very first desk on the right hand side is the registration desk.  We have given you a medication to help with the nausea as well today please pick that up at your pharmacy.  We will see you back as listed below:  We advised that you find a Primary Care Provider.

## 2016-12-01 NOTE — Telephone Encounter (Addendum)
Call made to Central Scheduling at this time. Spoke with [name] and she was able to get a RUQ Ultrasound schedule for 12/14 at 9:30AM with an arrival [facility]15[facility], npo 6-8 hours prior to this appointment. I verbalized understanding.   Call made to patient at this time. I advised her of her ultrasound on 12/14 at 9:30 with arrival time of 9:00AM due to patient taking transportation. I verbalized to patient to have nothing to eat or drink after midnight. Patient verbalized understanding after repeating the above statement to me.

## 2016-12-04 ENCOUNTER — Encounter: Payer: Self-pay | Admitting: Surgery

## 2016-12-15 ENCOUNTER — Ambulatory Visit: Payer: Medicaid Other

## 2017-01-23 ENCOUNTER — Encounter: Payer: Self-pay | Admitting: Emergency Medicine

## 2017-01-23 DIAGNOSIS — J209 Acute bronchitis, unspecified: Secondary | ICD-10-CM | POA: Diagnosis not present

## 2017-01-23 DIAGNOSIS — F17210 Nicotine dependence, cigarettes, uncomplicated: Secondary | ICD-10-CM | POA: Diagnosis not present

## 2017-01-23 DIAGNOSIS — R0602 Shortness of breath: Secondary | ICD-10-CM | POA: Diagnosis present

## 2017-01-23 DIAGNOSIS — J45901 Unspecified asthma with (acute) exacerbation: Secondary | ICD-10-CM | POA: Insufficient documentation

## 2017-01-23 NOTE — ED Triage Notes (Signed)
Patient brought in by ems from home. Patient with complaint of shortness of breath from her asthma. Lung sounds clear. Patient states that she does not use her inhaler.

## 2017-01-24 ENCOUNTER — Emergency Department: Payer: Medicaid Other

## 2017-01-24 ENCOUNTER — Emergency Department
Admission: EM | Admit: 2017-01-24 | Discharge: 2017-01-24 | Disposition: A | Discharge status: Home / Self Care | Payer: Medicaid Other | Attending: Emergency Medicine | Admitting: Emergency Medicine

## 2017-01-24 DIAGNOSIS — J209 Acute bronchitis, unspecified: Secondary | ICD-10-CM

## 2017-01-24 DIAGNOSIS — J45901 Unspecified asthma with (acute) exacerbation: Secondary | ICD-10-CM

## 2017-01-24 LAB — TROPONIN I

## 2017-01-24 LAB — CBC
HCT: 35 % (ref 35.0–47.0)
HEMOGLOBIN: 11.1 g/dL — AB (ref 12.0–16.0)
MCH: 27.3 pg (ref 26.0–34.0)
MCHC: 31.8 g/dL — ABNORMAL LOW (ref 32.0–36.0)
MCV: 86 fL (ref 80.0–100.0)
Platelets: 376 10*3/uL (ref 150–440)
RBC: 4.07 MIL/uL (ref 3.80–5.20)
RDW: 17.2 % — ABNORMAL HIGH (ref 11.5–14.5)
WBC: 6.8 10*3/uL (ref 3.6–11.0)

## 2017-01-24 LAB — BASIC METABOLIC PANEL
Anion gap: 9 (ref 5–15)
BUN: 7 mg/dL (ref 6–20)
CHLORIDE: 110 mmol/L (ref 101–111)
CO2: 24 mmol/L (ref 22–32)
Calcium: 9.3 mg/dL (ref 8.9–10.3)
Creatinine, Ser: 0.74 mg/dL (ref 0.44–1.00)
GFR calc Af Amer: >60 mL/min (ref >60)
GFR calc non Af Amer: >60 mL/min (ref >60)
GLUCOSE: 115 mg/dL — AB (ref 65–99)
POTASSIUM: 4 mmol/L (ref 3.5–5.1)
SODIUM: 143 mmol/L (ref 135–145)

## 2017-01-24 MED ORDER — IPRATROPIUM-ALBUTEROL 0.5-2.5 (3) MG/3ML IN SOLN
RESPIRATORY_TRACT | Status: AC
  Administered 2017-01-24: 3 mL
  Filled 2017-01-24: qty 3

## 2017-01-24 MED ORDER — ALBUTEROL SULFATE HFA 108 (90 BASE) MCG/ACT IN AERS: 2.0000 | INHALATION_SPRAY | Freq: Four times a day (QID) | RESPIRATORY_TRACT | 0 refills | Status: DC | PRN

## 2017-01-24 MED ORDER — PREDNISONE 20 MG PO TABS: 60.0000 mg | ORAL_TABLET | Freq: Every day | ORAL | 0 refills | Status: AC

## 2017-01-24 NOTE — ED Notes (Addendum)
[facility] pt st "I stop breathing when I'm sleeping"; denies any recent illness, denies any pain; denies any cough; resp even/unlab, lungs clear to auscultation; protocols added while waiting for exam room and acuity level changed

## 2017-01-24 NOTE — ED Notes (Signed)
Pt prepared for discharge. States she is feeling much better. Pt is encouraged to continue calling to find a ride home.

## 2017-01-24 NOTE — ED Notes (Signed)
Pt reports she has wheezing and chest discomfort.  Sx for several days.  cig smoker.  No fever.  No cough.  Pt alert.

## 2017-01-24 NOTE — ED Notes (Signed)
Report off to andrea rn  

## 2017-01-24 NOTE — ED Provider Notes (Signed)
[facility] Emergency Department Provider Note   First MD Initiated Contact with Patient 01/24/17 [phone]     (approximate)  I have reviewed the triage vital signs and the nursing notes.   HISTORY  Chief Complaint Asthma and Shortness of Breath    HPI Carmen Gomez is a 37 y.o. female with history of asthma and approximately half a pack tobacco use daily presents to the emergency department with wheezing and dyspnea times 2 days.  Patient states that she ran out of her inhaler.  Patient denies any fever.  Patient denies any chest pain at present.  Patient denies any lower externally pain or swelling.  Patient denies any personal family history of DVT or PE.   Past Medical History:  Diagnosis Date  . Asthma    WELL CONTROLLED-NO INHALERS  . 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 Cholelithiasis   . Depression    NO MEDS  . Diplopia   . GERD (gastroesophageal reflux disease)    NO MEDS  . Morbid obesity with BMI of 45.0-49.9, adult [facility])     Patient Active Problem List   Diagnosis Date Noted  . Cholelithiasis   . Asthma 08/29/2016  . Morbid obesity with BMI of 45.0-49.9, adult (HCC) 08/29/2016  . Diplopia 06/02/2013    Past Surgical History:  Procedure Laterality Date  . CHOLECYSTECTOMY N/A 10/25/2016   Procedure: LAPAROSCOPIC CHOLECYSTECTOMY;  Surgeon: Leafy Ro, MD;  Location: [facility] ORS;  Service: General;  Laterality: N/A;  . EYE SURGERY Left    DETACHED RETINA  . HERNIA REPAIR    . UMBILICAL HERNIA REPAIR N/A 10/25/2016   Procedure: HERNIA REPAIR UMBILICAL ADULT;  Surgeon: Leafy Ro, MD;  Location: [facility] ORS;  Service: General;  Laterality: N/A;    Prior to Admission medications   Medication Sig Start Date End Date Taking? Authorizing Provider  ondansetron (ZOFRAN) 4 MG tablet Take 1 tablet (4 mg total) by mouth every 8 (eight) hours as needed for nausea or vomiting. 12/01/16   Pabon, Merri Ray, MD    Allergies Bee venom  and Aspirin  Family History  Problem Relation Age of Onset  . Stroke Sister 33  . Diabetes Maternal Grandmother     Social History Social History   Tobacco Use  . Smoking status: Current Every Day Smoker    Packs/day: 0.50    Years: 18.00    Pack years: 9.00    Types: Cigarettes  . Smokeless tobacco: Never Used  Substance Use Topics  . Alcohol use: Yes    Comment: Approximately 12-18 Beers weekly  . Drug use: Yes    Types: Marijuana    Comment: Last use 08/30/16- verified with patient on 08/31/16    Review of Systems Constitutional: No fever/chills Eyes: No visual changes. ENT: No sore throat. Cardiovascular: Denies chest pain. Respiratory: Positive for dyspnea and wheezing Gastrointestinal: No abdominal pain.  No nausea, no vomiting.  No diarrhea.  No constipation. Genitourinary: Negative for dysuria. Musculoskeletal: Negative for neck pain.  Negative for back pain. Integumentary: Negative for rash. Neurological: Negative for headaches, focal weakness or numbness.   ____________________________________________   PHYSICAL EXAM:  VITAL SIGNS: ED Triage Vitals [01/23/17 2305]  Enc Vitals Group     BP (!) 132/91     Pulse Rate 96     Resp 18     Temp 97.6 F (36.4 C)     Temp Source Oral     SpO2  98 %     Weight 136.1 kg (300 lb)     Height 1.727 m (5\' 8" )     Head Circumference      Peak Flow      Pain Score 9     Pain Loc      Pain Edu?      Excl. in GC?     Constitutional: Alert and oriented. Well appearing and in no acute distress. Eyes: Conjunctivae are normal. Head: Atraumatic. Mouth/Throat: Mucous membranes are moist.  Oropharynx non-erythematous. Neck: No stridor.   Cardiovascular: Normal rate, regular rhythm. Good peripheral circulation. Grossly normal heart sounds. Respiratory: Normal respiratory effort.  No retractions.  Mild expiratory wheezing Gastrointestinal: Soft and nontender. No distention.  Musculoskeletal: No lower extremity  tenderness nor edema. No gross deformities of extremities. Neurologic:  Normal speech and language. No gross focal neurologic deficits are appreciated.  Skin:  Skin is warm, dry and intact. No rash noted. Psychiatric: Mood and affect are normal. Speech and behavior are normal.  ____________________________________________   LABS (all labs ordered are listed, but only abnormal results are displayed)  Labs Reviewed  CBC - Abnormal; Notable for the following components:      Result Value   Hemoglobin 11.1 (*)    MCHC 31.8 (*)    RDW 17.2 (*)    All other components within normal limits  BASIC METABOLIC PANEL - Abnormal; Notable for the following components:   Glucose, Bld 115 (*)    All other components within normal limits  TROPONIN I   ____________________________________________  EKG  ED ECG REPORT I, [place] Milica Gully, the attending physician, personally viewed and interpreted this ECG.   Date: 01/24/2017  EKG Time: 12:16 AM  Rate: 86  Rhythm: Normal sinus rhythm  Axis: Normal  Intervals: Normal  ST&T Change: None ____________________________________  RADIOLOGY I, [place] N Charm Stenner, personally viewed and evaluated these images (plain radiographs) as part of my medical decision making, as well as reviewing the written report by the radiologist.  Dg Chest 2 View  Result Date: 01/24/2017 CLINICAL DATA:  Shortness of breath EXAM: CHEST  2 VIEW COMPARISON:  08/13/2016 FINDINGS: Low lung volumes. Mild bronchitic changes. No acute consolidation or pleural effusion. Stable cardiomediastinal silhouette. No pneumothorax. IMPRESSION: No active cardiopulmonary disease.  Mild bronchitic changes Electronically Signed   By: Jasmine PangKim  Fujinaga M.D.   On: 01/24/2017 01:00     Procedures   ____________________________________________   INITIAL IMPRESSION / ASSESSMENT AND PLAN / ED COURSE  As part of my medical decision making, I reviewed the following data within the electronic medical  record: 37 year old female present with above-stated history and physical exam with concern for possible bronchitis versus asthma versus pneumonia.  Chest x-ray consistent with acute mild bronchitis changes as such patient given albuterol inhaler and prednisone. ____________________________________________  FINAL CLINICAL IMPRESSION(S) / ED DIAGNOSES  Final diagnoses:  Moderate asthma with exacerbation, unspecified whether persistent  Acute bronchitis, unspecified organism     MEDICATIONS GIVEN DURING THIS VISIT:  Medications  ipratropium-albuterol (DUONEB) 0.5-2.5 (3) MG/3ML nebulizer solution (3 mLs  Given 01/24/17 0253)     ED Discharge Orders    None       Note:  This document was prepared using Dragon voice recognition software and may include unintentional dictation errors.    Darci CurrentBrown, [place], MD 01/24/17 [phone]

## 2017-10-02 ENCOUNTER — Emergency Department
Admission: EM | Admit: 2017-10-02 | Discharge: 2017-10-02 | Disposition: A | Discharge status: Home / Self Care | Payer: Medicaid Other | Attending: Emergency Medicine | Admitting: Emergency Medicine

## 2017-10-02 ENCOUNTER — Other Ambulatory Visit: Payer: Self-pay

## 2017-10-02 ENCOUNTER — Encounter: Payer: Self-pay | Admitting: Emergency Medicine

## 2017-10-02 ENCOUNTER — Emergency Department: Payer: Medicaid Other

## 2017-10-02 DIAGNOSIS — W25XXXA Contact with sharp glass, initial encounter: Secondary | ICD-10-CM | POA: Diagnosis not present

## 2017-10-02 DIAGNOSIS — Z23 Encounter for immunization: Secondary | ICD-10-CM | POA: Diagnosis not present

## 2017-10-02 DIAGNOSIS — Y9389 Activity, other specified: Secondary | ICD-10-CM | POA: Insufficient documentation

## 2017-10-02 DIAGNOSIS — S51811A Laceration without foreign body of right forearm, initial encounter: Secondary | ICD-10-CM

## 2017-10-02 DIAGNOSIS — Y999 Unspecified external cause status: Secondary | ICD-10-CM | POA: Diagnosis not present

## 2017-10-02 DIAGNOSIS — Y929 Unspecified place or not applicable: Secondary | ICD-10-CM | POA: Diagnosis not present

## 2017-10-02 DIAGNOSIS — F17210 Nicotine dependence, cigarettes, uncomplicated: Secondary | ICD-10-CM | POA: Diagnosis not present

## 2017-10-02 DIAGNOSIS — J45909 Unspecified asthma, uncomplicated: Secondary | ICD-10-CM | POA: Insufficient documentation

## 2017-10-02 MED ORDER — CEFAZOLIN SODIUM 1 G IJ SOLR
1.0000 g | Freq: Once | INTRAMUSCULAR | Status: AC
  Administered 2017-10-02: 1 g via INTRAMUSCULAR
  Filled 2017-10-02: qty 10

## 2017-10-02 MED ORDER — OXYCODONE-ACETAMINOPHEN 5-325 MG PO TABS
1.0000 | ORAL_TABLET | Freq: Once | ORAL | Status: AC
  Administered 2017-10-02: 1 via ORAL
  Filled 2017-10-02: qty 1

## 2017-10-02 MED ORDER — LIDOCAINE-EPINEPHRINE 2 %-1:100000 IJ SOLN
INTRAMUSCULAR | Status: AC
  Administered 2017-10-02: 1 mL via INTRADERMAL
  Filled 2017-10-02: qty 1

## 2017-10-02 MED ORDER — LIDOCAINE-EPINEPHRINE 1 %-1:100000 IJ SOLN
30.0000 mL | Freq: Once | INTRAMUSCULAR | Status: DC
  Filled 2017-10-02: qty 30

## 2017-10-02 MED ORDER — CEPHALEXIN 500 MG PO CAPS: 500.0000 mg | ORAL_CAPSULE | Freq: Three times a day (TID) | ORAL | 0 refills | Status: AC

## 2017-10-02 MED ORDER — LIDOCAINE-EPINEPHRINE 2 %-1:100000 IJ SOLN
30.0000 mL | Freq: Once | INTRAMUSCULAR | Status: AC
  Administered 2017-10-02: 1 mL via INTRADERMAL

## 2017-10-02 MED ORDER — TETANUS-DIPHTH-ACELL PERTUSSIS 5-2.5-18.5 LF-MCG/0.5 IM SUSP
0.5000 mL | Freq: Once | INTRAMUSCULAR | Status: AC
  Administered 2017-10-02: 0.5 mL via INTRAMUSCULAR
  Filled 2017-10-02: qty 0.5

## 2017-10-02 NOTE — Progress Notes (Signed)
Chaplain responded to security lock down. Chaplain maintained space for staff support.    10/02/17 1800  Clinical Encounter Type  Visited With Health care provider  Visit Type Initial  Spiritual Encounters  Spiritual Needs Emotional
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
## 2017-10-02 NOTE — Discharge Instructions (Addendum)
Keep laceration dry and clean. Wash with warm water and soap. Apply topical bacitracin. Protect from the sun to minimize scarring. Cover it with SPF 70 or higher when out in the sun for 6-9 months. You received 11 stitches that must be removed in 7 days.  Watch for signs of infection: pus, redness of the skin surrounding it, or fever. If these develop see your doctor or return to the ER for antibiotics.
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
## 2018-03-11 ENCOUNTER — Emergency Department: Payer: Medicaid Other

## 2018-03-11 ENCOUNTER — Encounter: Payer: Self-pay | Admitting: Intensive Care

## 2018-03-11 ENCOUNTER — Emergency Department
Admission: EM | Admit: 2018-03-11 | Discharge: 2018-03-11 | Disposition: A | Discharge status: Home / Self Care | Payer: Medicaid Other | Attending: Emergency Medicine | Admitting: Emergency Medicine

## 2018-03-11 ENCOUNTER — Other Ambulatory Visit: Payer: Self-pay

## 2018-03-11 DIAGNOSIS — J45909 Unspecified asthma, uncomplicated: Secondary | ICD-10-CM | POA: Diagnosis not present

## 2018-03-11 DIAGNOSIS — Z79899 Other long term (current) drug therapy: Secondary | ICD-10-CM | POA: Insufficient documentation

## 2018-03-11 DIAGNOSIS — J209 Acute bronchitis, unspecified: Secondary | ICD-10-CM | POA: Insufficient documentation

## 2018-03-11 DIAGNOSIS — R05 Cough: Secondary | ICD-10-CM | POA: Diagnosis present

## 2018-03-11 DIAGNOSIS — F17210 Nicotine dependence, cigarettes, uncomplicated: Secondary | ICD-10-CM | POA: Diagnosis not present

## 2018-03-11 HISTORY — DX: Essential (primary) hypertension: I10

## 2018-03-11 MED ORDER — IPRATROPIUM-ALBUTEROL 0.5-2.5 (3) MG/3ML IN SOLN
3.0000 mL | Freq: Once | RESPIRATORY_TRACT | Status: AC
  Administered 2018-03-11: 3 mL via RESPIRATORY_TRACT
  Filled 2018-03-11: qty 3

## 2018-03-11 MED ORDER — PREDNISONE 10 MG (21) PO TBPK: ORAL_TABLET | ORAL | 0 refills | Status: DC

## 2018-03-11 MED ORDER — BENZONATATE 200 MG PO CAPS: 200.0000 mg | ORAL_CAPSULE | Freq: Three times a day (TID) | ORAL | 0 refills | Status: DC | PRN

## 2018-03-11 MED ORDER — AZITHROMYCIN 250 MG PO TABS: ORAL_TABLET | ORAL | 0 refills | Status: DC

## 2018-03-11 NOTE — Discharge Instructions (Addendum)
Follow-up with your regular doctor if not better in 3 days.  Take medications as prescribed.  Return emergency department worsening.  Tylenol and ibuprofen as needed.  Drink plenty of fluids.

## 2018-03-11 NOTE — ED Triage Notes (Signed)
Patient c/o chills/sweats, headache, cough and congestion since yesterday

## 2018-03-11 NOTE — ED Provider Notes (Signed)
[facility] Emergency Department Provider Note  ____________________________________________   First MD Initiated Contact with Patient 03/11/18 1848     (approximate)  I have reviewed the triage vital signs and the nursing notes.   HISTORY  Chief Complaint Influenza    HPI Carmen Gomez is a 38 y.o. female presents emergency department complaining of cough and congestion with yellow to green mucus.  Symptoms for 2 days.  Denies fever, chills, chest pain or shortness of breath.   Past Medical History:  Diagnosis Date  . Asthma    WELL CONTROLLED-NO INHALERS  . 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 Cholelithiasis   . Depression    NO MEDS  . Diplopia   . GERD (gastroesophageal reflux disease)    NO MEDS  . Hypertension   . Morbid obesity with BMI of 45.0-49.9, adult [facility])     Patient Active Problem List   Diagnosis Date Noted  . Cholelithiasis   . Asthma 08/29/2016  . Morbid obesity with BMI of 45.0-49.9, adult (HCC) 08/29/2016  . Diplopia 06/02/2013    Past Surgical History:  Procedure Laterality Date  . CHOLECYSTECTOMY N/A 10/25/2016   Procedure: LAPAROSCOPIC CHOLECYSTECTOMY;  Surgeon: Leafy Ro, MD;  Location: [facility] ORS;  Service: General;  Laterality: N/A;  . EYE SURGERY Left    DETACHED RETINA  . HERNIA REPAIR    . UMBILICAL HERNIA REPAIR N/A 10/25/2016   Procedure: HERNIA REPAIR UMBILICAL ADULT;  Surgeon: Leafy Ro, MD;  Location: [facility] ORS;  Service: General;  Laterality: N/A;    Prior to Admission medications   Medication Sig Start Date End Date Taking? Authorizing Provider  albuterol (PROVENTIL HFA;VENTOLIN HFA) 108 (90 Base) MCG/ACT inhaler Inhale 2 puffs into the lungs every 6 (six) hours as needed for wheezing or shortness of breath. 01/24/17   Darci Current, MD  azithromycin (ZITHROMAX Z-PAK) 250 MG tablet 2 pills today then 1 pill a day for 4 days 03/11/18   Sherrie Mustache Roselyn Bering, PA-C  benzonatate  (TESSALON) 200 MG capsule Take 1 capsule (200 mg total) by mouth 3 (three) times daily as needed for cough. 03/11/18   , Roselyn Bering, PA-C  ondansetron (ZOFRAN) 4 MG tablet Take 1 tablet (4 mg total) by mouth every 8 (eight) hours as needed for nausea or vomiting. 12/01/16   Pabon, Diego F, MD  predniSONE (STERAPRED UNI-PAK 21 TAB) 10 MG (21) TBPK tablet Take 6 pills on day one then decrease by 1 pill each day 03/11/18   Faythe Ghee, PA-C    Allergies Bee venom and Aspirin  Family History  Problem Relation Age of Onset  . Stroke Sister 53  . Diabetes Maternal Grandmother     Social History Social History   Tobacco Use  . Smoking status: Current Every Day Smoker    Packs/day: 0.50    Years: 18.00    Pack years: 9.00    Types: Cigarettes  . Smokeless tobacco: Never Used  Substance Use Topics  . Alcohol use: Yes    Comment: Approximately 12-18 Beers weekly  . Drug use: Yes    Types: Marijuana    Review of Systems  Constitutional: No fever/chills Eyes: No visual changes. ENT: No sore throat. Respiratory: Positive cough and congestion, positive wheezing Genitourinary: Negative for dysuria. Musculoskeletal: Negative for back pain. Skin: Negative for rash.    ____________________________________________   PHYSICAL EXAM:  VITAL SIGNS: ED Triage Vitals  Enc Vitals Group  BP 03/11/18 1840 (!) 156/93     Pulse Rate 03/11/18 1838 86     Resp 03/11/18 1838 14     Temp 03/11/18 1838 98.6 F (37 C)     Temp Source 03/11/18 1838 Oral     SpO2 03/11/18 1838 99 %     Weight 03/11/18 1838 296 lb (134.3 kg)     Height 03/11/18 1838 5\' 8"  (1.727 m)     Head Circumference --      Peak Flow --      Pain Score 03/11/18 1838 10     Pain Loc --      Pain Edu? --      Excl. in GC? --     Constitutional: Alert and oriented. Well appearing and in no acute distress. Eyes: Conjunctivae are normal.  Head: Atraumatic. ENT: TMS clear bilaterally Nose: No  congestion/rhinnorhea. Mouth/Throat: Mucous membranes are moist.   NECK: Is supple, no lymphadenopathy is noted  cardiovascular: Normal rate, regular rhythm.  Heart sounds are normal Respiratory: Normal respiratory effort.  No retractions, lungs with decreased air movement bilaterally GU: deferred Musculoskeletal: FROM all extremities, warm and well perfused Neurologic:  Normal speech and language.  Skin:  Skin is warm, dry and intact. No rash noted. Psychiatric: Mood and affect are normal. Speech and behavior are normal.  ____________________________________________   LABS (all labs ordered are listed, but only abnormal results are displayed)  Labs Reviewed - No data to display ____________________________________________   ____________________________________________  RADIOLOGY  Chest x-ray is normal  ____________________________________________   PROCEDURES  Procedure(s) performed: DuoNeb   Procedures    ____________________________________________   INITIAL IMPRESSION / ASSESSMENT AND PLAN / ED COURSE  Pertinent labs & imaging results that were available during my care of the patient were reviewed by me and considered in my medical decision making (see chart for details).   Patient is a 38 year old female presents emergency department with complaints of cough and congestion with headache.  Productive cough with green to yellow mucus.  No fever or chills.  No body aches.  Physical exam shows patient has dry hacking cough.  Decreased air movement noted in the lungs bilaterally.  Chest x-ray is normal DuoNeb, DuoNeb did increase the air movement bilaterally.  No wheezing is noted.  Explained all of the findings to the patient.  She was given a prescription for Z-Pak, Tessalon Perles, steroid pack.  She is to follow-up with regular doctor if not better in 3 days.  Return if worsening.  States she understands will comply.  She discharged stable condition.     As  part of my medical decision making, I reviewed the following data within the electronic MEDICAL RECORD NUMBER Nursing notes reviewed and incorporated, Old chart reviewed, Radiograph reviewed chest x-ray is normal, Notes from prior ED visits and [place] Controlled Substance Database  ____________________________________________   FINAL CLINICAL IMPRESSION(S) / ED DIAGNOSES  Final diagnoses:  Acute bronchitis, unspecified organism      NEW MEDICATIONS STARTED DURING THIS VISIT:  New Prescriptions   AZITHROMYCIN (ZITHROMAX Z-PAK) 250 MG TABLET    2 pills today then 1 pill a day for 4 days   BENZONATATE (TESSALON) 200 MG CAPSULE    Take 1 capsule (200 mg total) by mouth 3 (three) times daily as needed for cough.   PREDNISONE (STERAPRED UNI-PAK 21 TAB) 10 MG (21) TBPK TABLET    Take 6 pills on day one then decrease by 1 pill each day  Note:  This document was prepared using Dragon voice recognition software and may include unintentional dictation errors.     Faythe Ghee, PA-C 03/11/18 Teodora Medici, MD 03/12/18 [phone]

## 2018-09-30 IMAGING — CR DG CHEST 2V
2 series · 2 of 2 positions shown · non-contrast
Comparison: Chest radiograph performed 05/19/2016

CLINICAL DATA: Subacute onset of central chest pain and shortness
of breath. Initial encounter.

EXAM:
CHEST  2 VIEW

[chest pa]
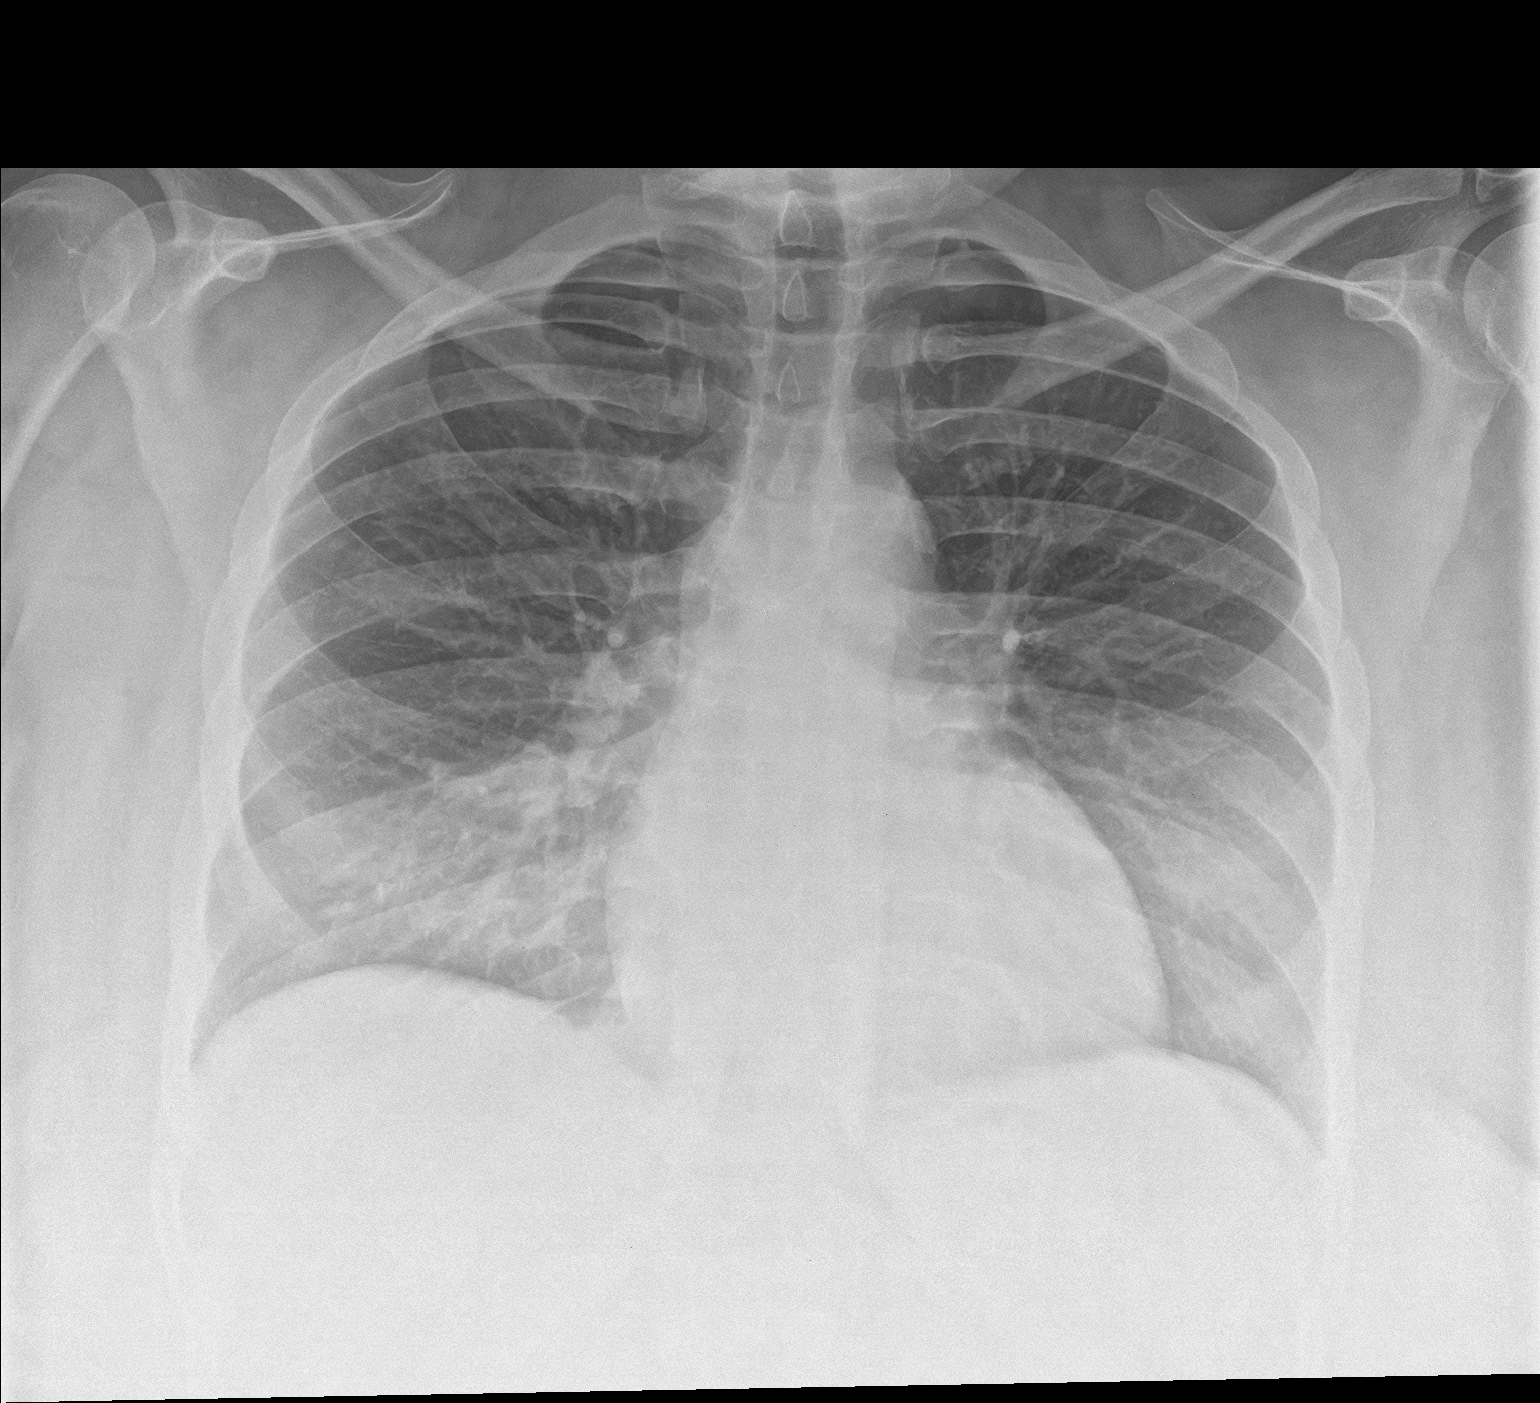

[chest lat]
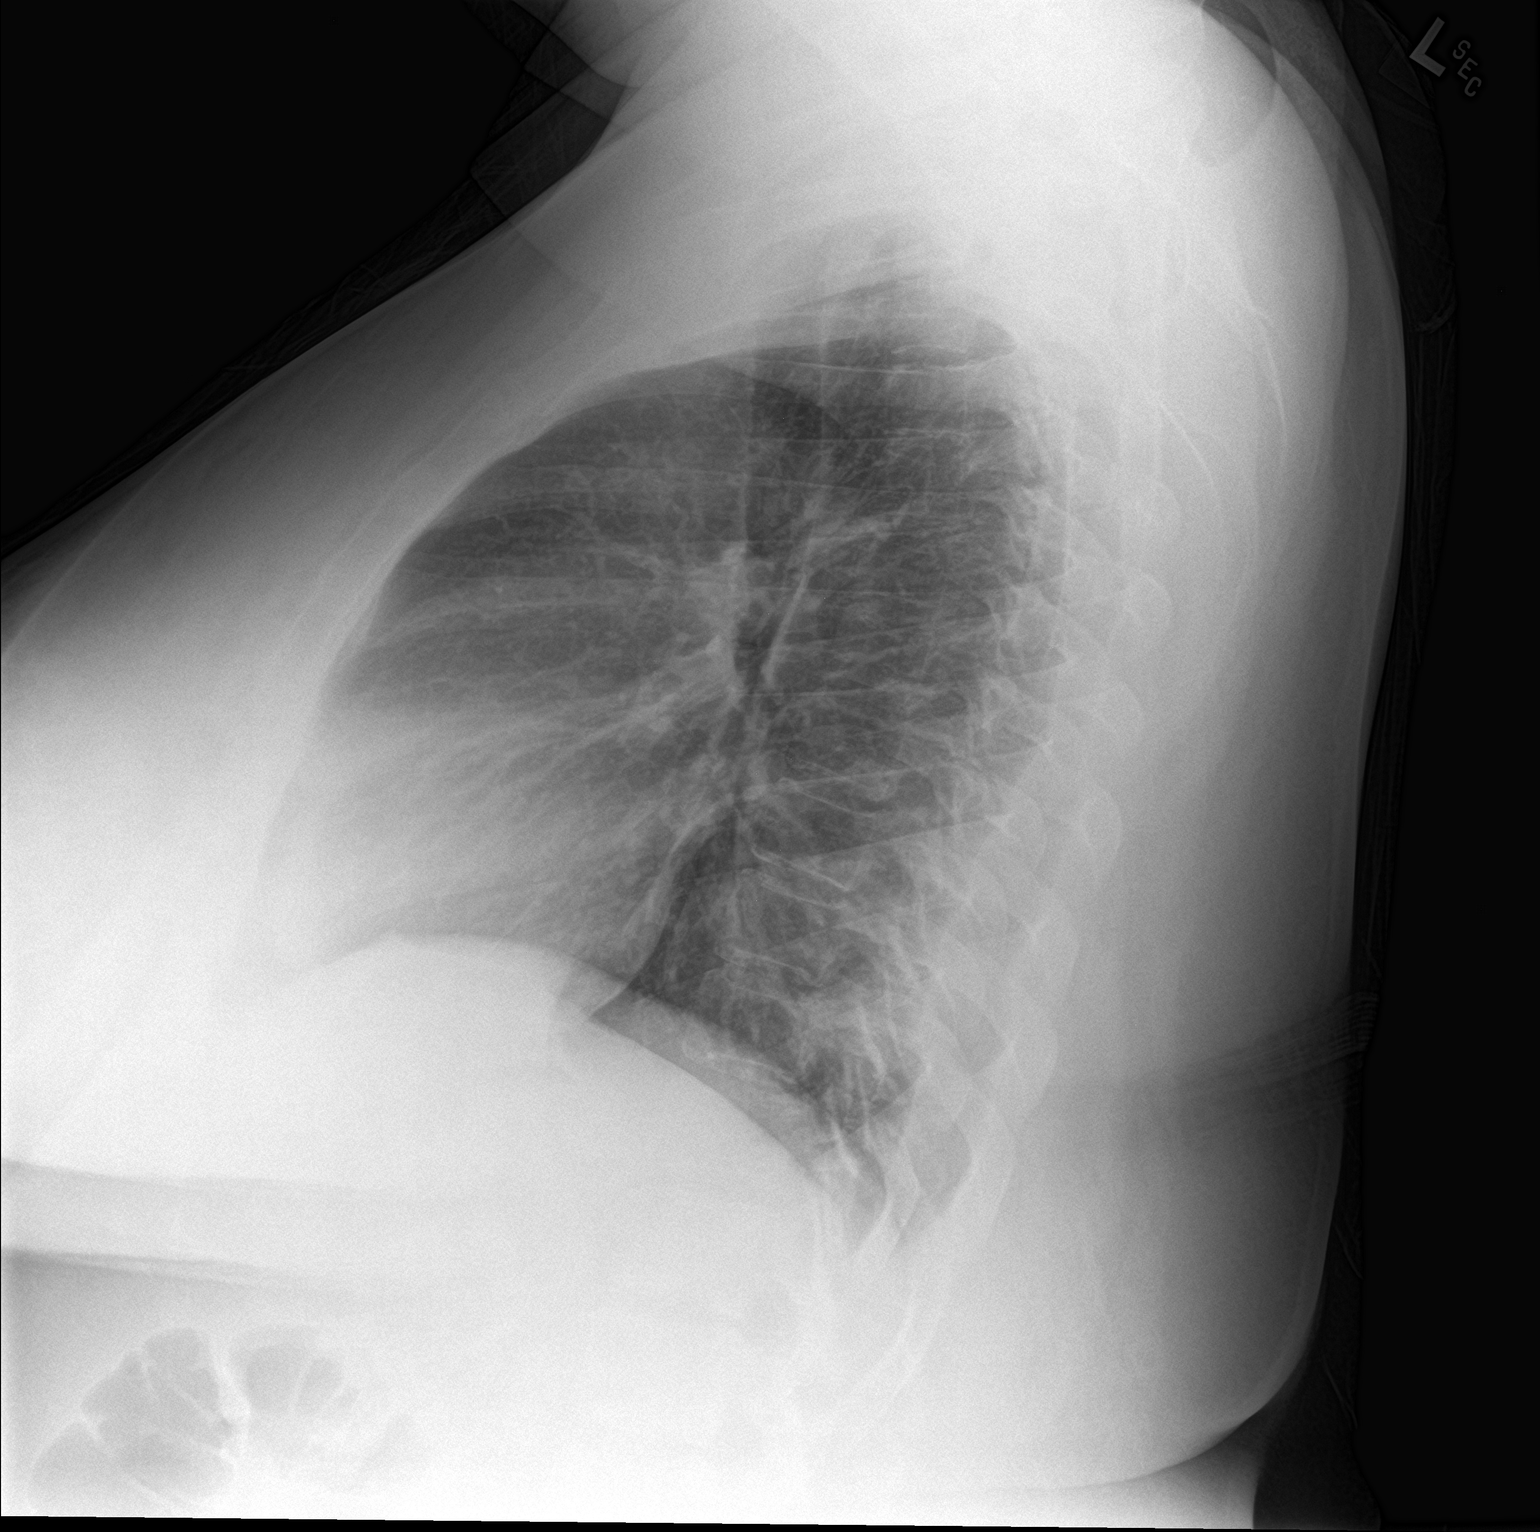

[2 of 2 positions shown; findings below may reference images not displayed]

FINDINGS: The lungs are well-aerated and clear. There is no evidence of focal
opacification, pleural effusion or pneumothorax.

The heart is normal in size; the mediastinal contour is within
normal limits. No acute osseous abnormalities are seen.
IMPRESSION: No acute cardiopulmonary process seen.

## 2018-11-08 ENCOUNTER — Other Ambulatory Visit: Payer: Medicaid Other | Attending: Family Medicine

## 2018-11-13 ENCOUNTER — Ambulatory Visit: Payer: Medicaid Other | Attending: Neurology

## 2019-03-30 ENCOUNTER — Emergency Department
Admission: EM | Admit: 2019-03-30 | Discharge: 2019-03-30 | Payer: Medicaid Other | Attending: Emergency Medicine | Admitting: Emergency Medicine

## 2019-03-30 ENCOUNTER — Emergency Department: Payer: Medicaid Other

## 2019-03-30 DIAGNOSIS — J45909 Unspecified asthma, uncomplicated: Secondary | ICD-10-CM | POA: Diagnosis not present

## 2019-03-30 DIAGNOSIS — Z79899 Other long term (current) drug therapy: Secondary | ICD-10-CM | POA: Diagnosis not present

## 2019-03-30 DIAGNOSIS — Y92009 Unspecified place in unspecified non-institutional (private) residence as the place of occurrence of the external cause: Secondary | ICD-10-CM | POA: Diagnosis not present

## 2019-03-30 DIAGNOSIS — H05231 Hemorrhage of right orbit: Secondary | ICD-10-CM | POA: Diagnosis not present

## 2019-03-30 DIAGNOSIS — Y9389 Activity, other specified: Secondary | ICD-10-CM | POA: Insufficient documentation

## 2019-03-30 DIAGNOSIS — Y999 Unspecified external cause status: Secondary | ICD-10-CM | POA: Diagnosis not present

## 2019-03-30 DIAGNOSIS — F17210 Nicotine dependence, cigarettes, uncomplicated: Secondary | ICD-10-CM | POA: Insufficient documentation

## 2019-03-30 DIAGNOSIS — I10 Essential (primary) hypertension: Secondary | ICD-10-CM | POA: Diagnosis not present

## 2019-03-30 DIAGNOSIS — S0993XA Unspecified injury of face, initial encounter: Secondary | ICD-10-CM

## 2019-03-30 DIAGNOSIS — S0990XA Unspecified injury of head, initial encounter: Secondary | ICD-10-CM | POA: Diagnosis present

## 2019-03-30 NOTE — Discharge Instructions (Addendum)
Apply ice for the swelling to go down. Take tylenol or ibuprofen for pain. Follow up with your doctor. Return to the ER for changes in vision, severe headache, or any other concerns.
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
## 2019-05-11 ENCOUNTER — Emergency Department
Admission: EM | Admit: 2019-05-11 | Discharge: 2019-05-11 | Disposition: A | Discharge status: Home / Self Care | Payer: Medicaid Other | Attending: Student | Admitting: Student

## 2019-05-11 ENCOUNTER — Other Ambulatory Visit: Payer: Self-pay

## 2019-05-11 DIAGNOSIS — Y907 Blood alcohol level of 200-239 mg/100 ml: Secondary | ICD-10-CM | POA: Insufficient documentation

## 2019-05-11 DIAGNOSIS — F329 Major depressive disorder, single episode, unspecified: Secondary | ICD-10-CM | POA: Diagnosis not present

## 2019-05-11 DIAGNOSIS — J45909 Unspecified asthma, uncomplicated: Secondary | ICD-10-CM | POA: Diagnosis not present

## 2019-05-11 DIAGNOSIS — I10 Essential (primary) hypertension: Secondary | ICD-10-CM | POA: Diagnosis not present

## 2019-05-11 DIAGNOSIS — R45851 Suicidal ideations: Secondary | ICD-10-CM | POA: Insufficient documentation

## 2019-05-11 DIAGNOSIS — Z7289 Other problems related to lifestyle: Secondary | ICD-10-CM | POA: Insufficient documentation

## 2019-05-11 DIAGNOSIS — F17210 Nicotine dependence, cigarettes, uncomplicated: Secondary | ICD-10-CM | POA: Diagnosis not present

## 2019-05-11 DIAGNOSIS — F10920 Alcohol use, unspecified with intoxication, uncomplicated: Secondary | ICD-10-CM | POA: Insufficient documentation

## 2019-05-11 DIAGNOSIS — F332 Major depressive disorder, recurrent severe without psychotic features: Secondary | ICD-10-CM

## 2019-05-11 DIAGNOSIS — F32A Depression, unspecified: Secondary | ICD-10-CM

## 2019-05-11 DIAGNOSIS — Z046 Encounter for general psychiatric examination, requested by authority: Secondary | ICD-10-CM | POA: Diagnosis not present

## 2019-05-11 LAB — CBC
HCT: 43.7 % (ref 36.0–46.0)
Hemoglobin: 13.5 g/dL (ref 12.0–15.0)
MCH: 31.6 pg (ref 26.0–34.0)
MCHC: 30.9 g/dL (ref 30.0–36.0)
MCV: 102.3 fL — ABNORMAL HIGH (ref 80.0–100.0)
Platelets: 268 10*3/uL (ref 150–400)
RBC: 4.27 MIL/uL (ref 3.87–5.11)
RDW: 15.7 % — ABNORMAL HIGH (ref 11.5–15.5)
WBC: 9.3 10*3/uL (ref 4.0–10.5)
nRBC: 0 % (ref 0.0–0.2)

## 2019-05-11 LAB — COMPREHENSIVE METABOLIC PANEL
ALT: 97 U/L — ABNORMAL HIGH (ref 0–44)
AST: 151 U/L — ABNORMAL HIGH (ref 15–41)
Albumin: 4.1 g/dL (ref 3.5–5.0)
Alkaline Phosphatase: 95 U/L (ref 38–126)
Anion gap: 11 (ref 5–15)
BUN: 9 mg/dL (ref 6–20)
CO2: 24 mmol/L (ref 22–32)
Calcium: 9 mg/dL (ref 8.9–10.3)
Chloride: 103 mmol/L (ref 98–111)
Creatinine, Ser: 0.39 mg/dL — ABNORMAL LOW (ref 0.44–1.00)
GFR calc Af Amer: >60 mL/min (ref >60)
GFR calc non Af Amer: >60 mL/min (ref >60)
Glucose, Bld: 133 mg/dL — ABNORMAL HIGH (ref 70–99)
Potassium: 3.5 mmol/L (ref 3.5–5.1)
Sodium: 138 mmol/L (ref 135–145)
Total Bilirubin: 0.6 mg/dL (ref 0.3–1.2)
Total Protein: 7.9 g/dL (ref 6.5–8.1)

## 2019-05-11 LAB — URINE DRUG SCREEN, QUALITATIVE (ARMC ONLY)
Amphetamines, Ur Screen: NOT DETECTED
Barbiturates, Ur Screen: NOT DETECTED
Benzodiazepine, Ur Scrn: NOT DETECTED
Cannabinoid 50 Ng, Ur ~~LOC~~: NOT DETECTED
Cocaine Metabolite,Ur ~~LOC~~: NOT DETECTED
MDMA (Ecstasy)Ur Screen: NOT DETECTED
Methadone Scn, Ur: NOT DETECTED
Opiate, Ur Screen: NOT DETECTED
Phencyclidine (PCP) Ur S: NOT DETECTED
Tricyclic, Ur Screen: NOT DETECTED

## 2019-05-11 LAB — ETHANOL: Alcohol, Ethyl (B): 221 mg/dL — ABNORMAL HIGH (ref <10)

## 2019-05-11 LAB — SALICYLATE LEVEL: Salicylate Lvl: <7 mg/dL — ABNORMAL LOW (ref 7.0–30.0)

## 2019-05-11 LAB — ACETAMINOPHEN LEVEL: Acetaminophen (Tylenol), Serum: <10 ug/mL — ABNORMAL LOW (ref 10–30)

## 2019-05-11 LAB — PREGNANCY, URINE: Preg Test, Ur: NEGATIVE

## 2019-05-11 NOTE — ED Notes (Signed)
IVC paperwork has been receded. 

## 2019-05-11 NOTE — ED Notes (Signed)
Pt dressed out with assist of jann, ed tech. The following items placed in one of one labeled bag: I phone, grey metal bangle bracelet, grey metal watch, green socks, black nike sandals, black jeans, black t shirt, grey metal ring, grey boxers, white bra.
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
## 2019-05-11 NOTE — ED Notes (Signed)
Carmen Gomez, ed tech escorted pt back to 20h with officer and pt's belonging bag.

## 2019-05-11 NOTE — ED Notes (Signed)
Pt given meal tray.

## 2019-05-11 NOTE — Consult Note (Signed)
[facility] Face-to-Face Psychiatry Consult    Reason for Consult:  Psych evaluation Referring Physician:  Dr. Colon Branch Patient Identification: Carmen Gomez MRN:  [id number] Principal Diagnosis: <principal problem not specified> Diagnosis:  Active Problems:   * No active hospital problems. *   Total Time spent with patient: 30 minutes  Subjective:   Carmen Gomez is a 39 y.o. female patient admitted to [facility] Pt here with bpd with ivc papers for possible SI. Pt denies SI. Pt is   HPI: per EDP: Carmen Gomez is a 39 y.o. female past medical history as below, who presents to the emergency department under IVC.  Per IVC paperwork, respondent was stabbing herself.  Patient states she was doing this with a fork, did not cause herself any injury.  Per IVC paperwork "she is tired of living and wishes to go be with her deceased mother."  States she has been off her medications, including her antihypertensives and depression medication for some time and is not interested in restarting them.  Patient denies SI here, is not particularly conversational and does not provide any further history.  Patient  Still denies SI during writer assessment. Does endorse depression but states she would not hurt herself or anyone else.  Recommend DC in this am.  Past Psychiatric History: depression  Risk to Self:  no Risk to Others:  no Prior Inpatient Therapy:  no Prior Outpatient Therapy:  no  Past Medical History:  Past Medical History:  Diagnosis Date  . Asthma    WELL CONTROLLED-NO INHALERS  . 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 Cholelithiasis   . Depression    NO MEDS  . Diplopia   . GERD (gastroesophageal reflux disease)    NO MEDS  . Hypertension   . Morbid obesity with BMI of 45.0-49.9, adult [facility])     Past Surgical History:  Procedure Laterality Date  . CHOLECYSTECTOMY N/A 10/25/2016   Procedure: LAPAROSCOPIC CHOLECYSTECTOMY;  Surgeon: Leafy Ro, MD;  Location: [facility]  ORS;  Service: General;  Laterality: N/A;  . EYE SURGERY Left    DETACHED RETINA  . HERNIA REPAIR    . UMBILICAL HERNIA REPAIR N/A 10/25/2016   Procedure: HERNIA REPAIR UMBILICAL ADULT;  Surgeon: Leafy Ro, MD;  Location: [facility] ORS;  Service: General;  Laterality: N/A;   Family History:  Family History  Problem Relation Age of Onset  . Stroke Sister 31  . Diabetes Maternal Grandmother    Family Psychiatric  History: unknown Social History:  Social History   Substance and Sexual Activity  Alcohol Use Yes   Comment: Approximately 12-18 Beers weekly     Social History   Substance and Sexual Activity  Drug Use Yes  . Types: Marijuana    Social History   Socioeconomic History  . Marital status: Single    Spouse name: Not on file  . Number of children: Not on file  . Years of education: Not on file  . Highest education level: Not on file  Occupational History  . Not on file  Tobacco Use  . Smoking status: Current Every Day Smoker    Packs/day: 0.50    Years: 18.00    Pack years: 9.00    Types: Cigarettes  . Smokeless tobacco: Never Used  Substance and Sexual Activity  . Alcohol use: Yes    Comment: Approximately 12-18 Beers weekly  . Drug use: Yes    Types: Marijuana  . Sexual activity: Not  on file  Other Topics Concern  . Not on file  Social History Narrative  . Not on file   Social Determinants of Health   Financial Resource Strain:   . Difficulty of Paying Living Expenses:   Food Insecurity:   . Worried About Programme researcher, broadcasting/film/video in the Last Year:   . Barista in the Last Year:   Transportation Needs:   . Freight forwarder (Medical):   Marland Kitchen Lack of Transportation (Non-Medical):   Physical Activity:   . Days of Exercise per Week:   . Minutes of Exercise per Session:   Stress:   . Feeling of Stress :   Social Connections:   . Frequency of Communication with Friends and Family:   . Frequency of Social Gatherings with Friends and Family:    . Attends Religious Services:   . Active Member of Clubs or Organizations:   . Attends Banker Meetings:   Marland Kitchen Marital Status:    Additional Social History:    Allergies:   Allergies  Allergen Reactions  . Bee Venom Swelling  . Aspirin Nausea And Vomiting    Labs:  Results for orders placed or performed during the hospital encounter of 05/11/19 (from the past 48 hour(s))  Comprehensive metabolic panel     Status: Abnormal   Collection Time: 05/11/19  3:43 AM  Result Value Ref Range   Sodium 138 135 - 145 mmol/L   Potassium 3.5 3.5 - 5.1 mmol/L   Chloride 103 98 - 111 mmol/L   CO2 24 22 - 32 mmol/L   Glucose, Bld 133 (H) 70 - 99 mg/dL    Comment: Glucose reference range applies only to samples taken after fasting for at least 8 hours.   BUN 9 6 - 20 mg/dL   Creatinine, Ser 5.17 (L) 0.44 - 1.00 mg/dL   Calcium 9.0 8.9 - 00.1 mg/dL   Total Protein 7.9 6.5 - 8.1 g/dL   Albumin 4.1 3.5 - 5.0 g/dL   AST 749 (H) 15 - 41 U/L   ALT 97 (H) 0 - 44 U/L   Alkaline Phosphatase 95 38 - 126 U/L   Total Bilirubin 0.6 0.3 - 1.2 mg/dL   GFR calc non Af Amer >60 >60 mL/min   GFR calc Af Amer >60 >60 mL/min   Anion gap 11 5 - 15    Comment: Performed at [facility], [address] [place], [place] [address]  Ethanol     Status: Abnormal   Collection Time: 05/11/19  3:43 AM  Result Value Ref Range   Alcohol, Ethyl (B) 221 (H) <10 mg/dL    Comment: (NOTE) Lowest detectable limit for serum alcohol is 10 mg/dL. For medical purposes only. Performed at [facility], [address] [place], [place] [address]   Salicylate level     Status: Abnormal   Collection Time: 05/11/19  3:43 AM  Result Value Ref Range   Salicylate Lvl <7.0 (L) 7.0 - 30.0 mg/dL    Comment: Performed at [facility], [address] [place], [place] [address]  Acetaminophen level     Status: Abnormal   Collection Time: 05/11/19  3:43 AM  Result Value Ref Range    Acetaminophen (Tylenol), Serum <10 (L) 10 - 30 ug/mL    Comment: (NOTE) Therapeutic concentrations vary significantly. A range of 10-30 ug/mL  may be an effective concentration for many patients. However, some  are best treated at concentrations outside of  this range. Acetaminophen concentrations >150 ug/mL at 4 hours after ingestion  and >50 ug/mL at 12 hours after ingestion are often associated with  toxic reactions. Performed at [facility], [address] [place], [place] [address]   cbc     Status: Abnormal   Collection Time: 05/11/19  3:43 AM  Result Value Ref Range   WBC 9.3 4.0 - 10.5 K/uL   RBC 4.27 3.87 - 5.11 MIL/uL   Hemoglobin 13.5 12.0 - 15.0 g/dL   HCT 43.7 36.0 - 46.0 %   MCV 102.3 (H) 80.0 - 100.0 fL   MCH 31.6 26.0 - 34.0 pg   MCHC 30.9 30.0 - 36.0 g/dL   RDW 15.7 (H) 11.5 - 15.5 %   Platelets 268 150 - 400 K/uL   nRBC 0.0 0.0 - 0.2 %    Comment: Performed at [facility], [address], [place], [place] [address]  Urine Drug Screen, Qualitative     Status: None   Collection Time: 05/11/19  4:14 AM  Result Value Ref Range   Tricyclic, Ur Screen NONE DETECTED NONE DETECTED   Amphetamines, Ur Screen NONE DETECTED NONE DETECTED   MDMA (Ecstasy)Ur Screen NONE DETECTED NONE DETECTED   Cocaine Metabolite,Ur [place] NONE DETECTED NONE DETECTED   Opiate, Ur Screen NONE DETECTED NONE DETECTED   Phencyclidine (PCP) Ur S NONE DETECTED NONE DETECTED   Cannabinoid 50 Ng, Ur [place] NONE DETECTED NONE DETECTED   Barbiturates, Ur Screen NONE DETECTED NONE DETECTED   Benzodiazepine, Ur Scrn NONE DETECTED NONE DETECTED   Methadone Scn, Ur NONE DETECTED NONE DETECTED    Comment: (NOTE) Tricyclics + metabolites, urine    Cutoff 1000 ng/mL Amphetamines + metabolites, urine  Cutoff 1000 ng/mL MDMA (Ecstasy), urine              Cutoff 500 ng/mL Cocaine Metabolite, urine          Cutoff 300 ng/mL Opiate + metabolites, urine        Cutoff 300 ng/mL Phencyclidine  (PCP), urine         Cutoff 25 ng/mL Cannabinoid, urine                 Cutoff 50 ng/mL Barbiturates + metabolites, urine  Cutoff 200 ng/mL Benzodiazepine, urine              Cutoff 200 ng/mL Methadone, urine                   Cutoff 300 ng/mL The urine drug screen provides only a preliminary, unconfirmed analytical test result and should not be used for non-medical purposes. Clinical consideration and professional judgment should be applied to any positive drug screen result due to possible interfering substances. A more specific alternate chemical method must be used in order to obtain a confirmed analytical result. Gas chromatography / mass spectrometry (GC/MS) is the preferred confirmat ory method. Performed at [facility], [address] [place], [place] [address]   Pregnancy, urine     Status: None   Collection Time: 05/11/19  4:14 AM  Result Value Ref Range   Preg Test, Ur NEGATIVE NEGATIVE    Comment: Performed at [facility], [address] [place], [place] [address]    No current facility-administered medications for this encounter.   Current Outpatient Medications  Medication Sig Dispense Refill  . albuterol (PROVENTIL HFA;VENTOLIN HFA) 108 (90 Base) MCG/ACT inhaler Inhale 2 puffs into the lungs every 6 (six) hours as  needed for wheezing or shortness of breath. 1 Inhaler 0  . azithromycin (ZITHROMAX Z-PAK) 250 MG tablet 2 pills today then 1 pill a day for 4 days 6 each 0  . benzonatate (TESSALON) 200 MG capsule Take 1 capsule (200 mg total) by mouth 3 (three) times daily as needed for cough. 30 capsule 0  . ondansetron (ZOFRAN) 4 MG tablet Take 1 tablet (4 mg total) by mouth every 8 (eight) hours as needed for nausea or vomiting. 20 tablet 0  . predniSONE (STERAPRED UNI-PAK 21 TAB) 10 MG (21) TBPK tablet Take 6 pills on day one then decrease by 1 pill each day 21 tablet 0    Musculoskeletal: Strength & Muscle Tone: within normal limits Gait &  Station: normal Patient leans: N/A  Psychiatric Specialty Exam: Physical Exam  Nursing note and vitals reviewed. Constitutional: She is oriented to person, place, and time. She appears well-developed.  HENT:  Head: Normocephalic.  Eyes: Pupils are equal, round, and reactive to light.  Respiratory: Effort normal.  Musculoskeletal:        General: Normal range of motion.     Cervical back: Normal range of motion.  Neurological: She is alert and oriented to person, place, and time.  Skin: Skin is warm and dry.  Psychiatric: Her speech is normal and behavior is normal. Judgment and thought content normal. Her affect is blunt. Cognition and memory are normal. She exhibits a depressed mood.    Review of Systems  Psychiatric/Behavioral: Positive for agitation. Negative for hallucinations and suicidal ideas. The patient is not hyperactive.   All other systems reviewed and are negative.   Blood pressure (!) 149/86, pulse 99, temperature 97.8 F (36.6 C), temperature source Oral, resp. rate 18, height 5\' 9"  (1.753 m), weight 127 kg, last menstrual period 04/16/2019, SpO2 94 %.Body mass index is 41.35 kg/m.  General Appearance: Casual  Eye Contact:  Good  Speech:  Clear and Coherent  Volume:  Normal  Mood:  Depressed  Affect:  Congruent  Thought Process:  Coherent  Orientation:  Full (Time, Place, and Person)  Thought Content:  WDL  Suicidal Thoughts:  No  Homicidal Thoughts:  No  Memory:  Immediate;   Good  Judgement:  Fair  Insight:  Good  Psychomotor Activity:  Normal  Concentration:  Concentration: Fair  Recall:  Fair  Fund of Knowledge:  Good  Language:  Good  Akathisia:  NA  Handed:  Right  AIMS (if indicated):     Assets:  Housing Social Support  ADL's:  Intact  Cognition:  WNL  Sleep:         Disposition: No evidence of imminent risk to self or others at present.   Patient does not meet criteria for psychiatric inpatient admission. Discussed crisis plan, support  from social network, calling 911, coming to the Emergency Department, and calling Suicide Hotline.  04/18/2019, NP 05/11/2019 6:03 AM

## 2019-05-11 NOTE — ED Notes (Signed)
Patient unable to sign due to E-signature pad not working.

## 2019-05-11 NOTE — Consult Note (Signed)
[facility] Face-to-Face Psychiatry Consult   Reason for Consult:  IVC'd for alcohol intoxicaiton Referring Physician:  EPD Patient Identification: Carmen Gomez MRN:  [id number] Principal Diagnosis: Depression Diagnosis:  Principal Problem:   Depression   Total Time spent with patient: 15 minutes  Subjective:   Carmen Gomez is a 39 y.o. female. Was seen and evaluated resting in bed.  She is awake, alert and oriented x3.  Denying suicidal or homicidal ideations.  Denies auditory or visual hallucinations.  Patient reports drinking beers on last night's and is unable to recall events leading to her admission.  Was reported that she threatened to stab himself with a fork.  Patient denies that these events currently happen.  Reports her son Carmen Gomez contacted the police.  Presents slightly guarded with events.  Denies that she is followed by psychiatry in the past.  Denies previous inpatient admissions.  Denies that she is prescribed medications daily for mental health.  Per IVC respondent was intoxication with a BAL 212 on admission, with reported threats to stabb herself. on assessment patient is denying suicidial or homicidal ideations.  Case staffed with attending psychiatrist Clary. Patient to be discharged with outpatient resources.  And CSW to provide additional substance use treatment.  Support encouragement reassurance was provided.   HPI: Per admission assessment note:Carmen V Summersis a 38 y.o.femalepast medical history as below, who presents to the emergency department under IVC. Per IVC paperwork, respondent was stabbing herself. Patient states she was doing this with a fork, did not cause herself any injury. Per IVC paperwork "she is tired of living and wishes to go be with her deceased mother."States she has been off her medications, including her antihypertensives and depression medication for some time and is not interested in restarting them.  Past Psychiatric  History:  Risk to Self:   Risk to Others:   Prior Inpatient Therapy:   Prior Outpatient Therapy:    Past Medical History:  Past Medical History:  Diagnosis Date  . Asthma    WELL CONTROLLED-NO INHALERS  . 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 Cholelithiasis   . Depression    NO MEDS  . Diplopia   . GERD (gastroesophageal reflux disease)    NO MEDS  . Hypertension   . Morbid obesity with BMI of 45.0-49.9, adult [facility])     Past Surgical History:  Procedure Laterality Date  . CHOLECYSTECTOMY N/A 10/25/2016   Procedure: LAPAROSCOPIC CHOLECYSTECTOMY;  Surgeon: Leafy Ro, MD;  Location: [facility] ORS;  Service: General;  Laterality: N/A;  . EYE SURGERY Left    DETACHED RETINA  . HERNIA REPAIR    . UMBILICAL HERNIA REPAIR N/A 10/25/2016   Procedure: HERNIA REPAIR UMBILICAL ADULT;  Surgeon: Leafy Ro, MD;  Location: [facility] ORS;  Service: General;  Laterality: N/A;   Family History:  Family History  Problem Relation Age of Onset  . Stroke Sister 39  . Diabetes Maternal Grandmother    Family Psychiatric  History:  Social History:  Social History   Substance and Sexual Activity  Alcohol Use Yes   Comment: Approximately 12-18 Beers weekly     Social History   Substance and Sexual Activity  Drug Use Yes  . Types: Marijuana    Social History   Socioeconomic History  . Marital status: Single    Spouse name: Not on file  . Number of children: Not on file  . Years of education: Not on file  . Highest  education level: Not on file  Occupational History  . Not on file  Tobacco Use  . Smoking status: Current Every Day Smoker    Packs/day: 0.50    Years: 18.00    Pack years: 9.00    Types: Cigarettes  . Smokeless tobacco: Never Used  Substance and Sexual Activity  . Alcohol use: Yes    Comment: Approximately 12-18 Beers weekly  . Drug use: Yes    Types: Marijuana  . Sexual activity: Not on file  Other Topics Concern  . Not on file  Social History  Narrative  . Not on file   Social Determinants of Health   Financial Resource Strain:   . Difficulty of Paying Living Expenses:   Food Insecurity:   . Worried About Programme researcher, broadcasting/film/video in the Last Year:   . Barista in the Last Year:   Transportation Needs:   . Freight forwarder (Medical):   Marland Kitchen Lack of Transportation (Non-Medical):   Physical Activity:   . Days of Exercise per Week:   . Minutes of Exercise per Session:   Stress:   . Feeling of Stress :   Social Connections:   . Frequency of Communication with Friends and Family:   . Frequency of Social Gatherings with Friends and Family:   . Attends Religious Services:   . Active Member of Clubs or Organizations:   . Attends Banker Meetings:   Marland Kitchen Marital Status:    Additional Social History:    Allergies:   Allergies  Allergen Reactions  . Bee Venom Swelling  . Aspirin Nausea And Vomiting    Labs:  Results for orders placed or performed during the hospital encounter of 05/11/19 (from the past 48 hour(s))  Comprehensive metabolic panel     Status: Abnormal   Collection Time: 05/11/19  3:43 AM  Result Value Ref Range   Sodium 138 135 - 145 mmol/L   Potassium 3.5 3.5 - 5.1 mmol/L   Chloride 103 98 - 111 mmol/L   CO2 24 22 - 32 mmol/L   Glucose, Bld 133 (H) 70 - 99 mg/dL    Comment: Glucose reference range applies only to samples taken after fasting for at least 8 hours.   BUN 9 6 - 20 mg/dL   Creatinine, Ser 7.59 (L) 0.44 - 1.00 mg/dL   Calcium 9.0 8.9 - 16.3 mg/dL   Total Protein 7.9 6.5 - 8.1 g/dL   Albumin 4.1 3.5 - 5.0 g/dL   AST 846 (H) 15 - 41 U/L   ALT 97 (H) 0 - 44 U/L   Alkaline Phosphatase 95 38 - 126 U/L   Total Bilirubin 0.6 0.3 - 1.2 mg/dL   GFR calc non Af Amer >60 >60 mL/min   GFR calc Af Amer >60 >60 mL/min   Anion gap 11 5 - 15    Comment: Performed at [facility], [address], [place], [place] [address]  Ethanol     Status: Abnormal   Collection Time:  05/11/19  3:43 AM  Result Value Ref Range   Alcohol, Ethyl (B) 221 (H) <10 mg/dL    Comment: (NOTE) Lowest detectable limit for serum alcohol is 10 mg/dL. For medical purposes only. Performed at [facility], [address] [place], [place] [address]   Salicylate level     Status: Abnormal   Collection Time: 05/11/19  3:43 AM  Result Value Ref Range   Salicylate Lvl <7.0 (L) 7.0 -  30.0 mg/dL    Comment: Performed at [facility], [address] [place], [place] [address]  Acetaminophen level     Status: Abnormal   Collection Time: 05/11/19  3:43 AM  Result Value Ref Range   Acetaminophen (Tylenol), Serum <10 (L) 10 - 30 ug/mL    Comment: (NOTE) Therapeutic concentrations vary significantly. A range of 10-30 ug/mL  may be an effective concentration for many patients. However, some  are best treated at concentrations outside of this range. Acetaminophen concentrations >150 ug/mL at 4 hours after ingestion  and >50 ug/mL at 12 hours after ingestion are often associated with  toxic reactions. Performed at [facility], [address] [place], [place] [address]   cbc     Status: Abnormal   Collection Time: 05/11/19  3:43 AM  Result Value Ref Range   WBC 9.3 4.0 - 10.5 K/uL   RBC 4.27 3.87 - 5.11 MIL/uL   Hemoglobin 13.5 12.0 - 15.0 g/dL   HCT 43.7 36.0 - 46.0 %   MCV 102.3 (H) 80.0 - 100.0 fL   MCH 31.6 26.0 - 34.0 pg   MCHC 30.9 30.0 - 36.0 g/dL   RDW 15.7 (H) 11.5 - 15.5 %   Platelets 268 150 - 400 K/uL   nRBC 0.0 0.0 - 0.2 %    Comment: Performed at [facility], [address] [place], [place] [address]  Urine Drug Screen, Qualitative     Status: None   Collection Time: 05/11/19  4:14 AM  Result Value Ref Range   Tricyclic, Ur Screen NONE DETECTED NONE DETECTED   Amphetamines, Ur Screen NONE DETECTED NONE DETECTED   MDMA (Ecstasy)Ur Screen NONE DETECTED NONE DETECTED   Cocaine Metabolite,Ur [place] NONE DETECTED NONE DETECTED    Opiate, Ur Screen NONE DETECTED NONE DETECTED   Phencyclidine (PCP) Ur S NONE DETECTED NONE DETECTED   Cannabinoid 50 Ng, Ur [place] NONE DETECTED NONE DETECTED   Barbiturates, Ur Screen NONE DETECTED NONE DETECTED   Benzodiazepine, Ur Scrn NONE DETECTED NONE DETECTED   Methadone Scn, Ur NONE DETECTED NONE DETECTED    Comment: (NOTE) Tricyclics + metabolites, urine    Cutoff 1000 ng/mL Amphetamines + metabolites, urine  Cutoff 1000 ng/mL MDMA (Ecstasy), urine              Cutoff 500 ng/mL Cocaine Metabolite, urine          Cutoff 300 ng/mL Opiate + metabolites, urine        Cutoff 300 ng/mL Phencyclidine (PCP), urine         Cutoff 25 ng/mL Cannabinoid, urine                 Cutoff 50 ng/mL Barbiturates + metabolites, urine  Cutoff 200 ng/mL Benzodiazepine, urine              Cutoff 200 ng/mL Methadone, urine                   Cutoff 300 ng/mL The urine drug screen provides only a preliminary, unconfirmed analytical test result and should not be used for non-medical purposes. Clinical consideration and professional judgment should be applied to any positive drug screen result due to possible interfering substances. A more specific alternate chemical method must be used in order to obtain a confirmed analytical result. Gas chromatography / mass spectrometry (GC/MS) is the preferred confirmat ory method. Performed at [facility], [address] [place], [place] [address]   Pregnancy, urine  Status: None   Collection Time: 05/11/19  4:14 AM  Result Value Ref Range   Preg Test, Ur NEGATIVE NEGATIVE    Comment: Performed at [facility], [address] [place], [place] [address]    No current facility-administered medications for this encounter.   Current Outpatient Medications  Medication Sig Dispense Refill  . albuterol (PROVENTIL HFA;VENTOLIN HFA) 108 (90 Base) MCG/ACT inhaler Inhale 2 puffs into the lungs every 6 (six) hours as needed for wheezing or  shortness of breath. 1 Inhaler 0  . azithromycin (ZITHROMAX Z-PAK) 250 MG tablet 2 pills today then 1 pill a day for 4 days 6 each 0  . benzonatate (TESSALON) 200 MG capsule Take 1 capsule (200 mg total) by mouth 3 (three) times daily as needed for cough. 30 capsule 0  . ondansetron (ZOFRAN) 4 MG tablet Take 1 tablet (4 mg total) by mouth every 8 (eight) hours as needed for nausea or vomiting. 20 tablet 0  . predniSONE (STERAPRED UNI-PAK 21 TAB) 10 MG (21) TBPK tablet Take 6 pills on day one then decrease by 1 pill each day 21 tablet 0    Musculoskeletal: Strength & Muscle Tone: within normal limits Gait & Station: normal Patient leans: N/A  Psychiatric Specialty Exam: Physical Exam  Vitals reviewed. Constitutional: She appears well-developed.  Psychiatric: She has a normal mood and affect. Her behavior is normal.    Review of Systems  Psychiatric/Behavioral: Negative for suicidal ideas.  All other systems reviewed and are negative.   Blood pressure (!) 149/86, pulse 99, temperature 97.8 F (36.6 C), temperature source Oral, resp. rate 18, height 5\' 9"  (1.753 m), weight 127 kg, last menstrual period 04/16/2019, SpO2 94 %.Body mass index is 41.35 kg/m.  General Appearance: Casual  Eye Contact:  Good  Speech:  Clear and Coherent  Volume:  Normal  Mood:  Anxious  Affect:  Congruent  Thought Process:  Coherent  Orientation:  Full (Time, Place, and Person)  Thought Content:  WDL and Logical  Suicidal Thoughts:  No  Homicidal Thoughts:  No  Memory:  Immediate;   Fair Recent;   Fair  Judgement:  Fair  Insight:  Fair  Psychomotor Activity:  Normal  Concentration:  Concentration: Fair  Recall:  04/18/2019 of Knowledge:  Fair  Language:  Fair  Akathisia:  No  Handed:  Right  AIMS (if indicated):     Assets:  Communication Skills Desire for Improvement  ADL's:  Intact  Cognition:  WNL  Sleep:      NP spoke to EDP regarding discharge disposition -NP to rescind involuntary  commitment -CSW to provide additional outpatient resources for substance abuse  Disposition: No evidence of imminent risk to self or others at present.   Patient does not meet criteria for psychiatric inpatient admission. Supportive therapy provided about ongoing stressors. Refer to IOP. Discussed crisis plan, support from social network, calling 911, coming to the Emergency Department, and calling Suicide Hotline.  [employer], NP 05/11/2019 10:46 AM

## 2019-05-11 NOTE — ED Provider Notes (Signed)
Patient was seen by the psychiatric team.  Patient clinically sober and is denying any SI at this time.  Patient was cleared by the psychiatric team and IVC was rescinded by them.   Concha Se, MD 05/11/19 [phone]

## 2019-05-11 NOTE — ED Triage Notes (Signed)
Pt here with bpd with ivc papers for possible SI. Pt denies SI. Pt is cooperative.

## 2019-05-11 NOTE — Discharge Instructions (Addendum)
You are cleared by the psychiatric team for discharge home.  Your liver function tests were slightly elevated.  This mostly secondary to alcohol use.  You should cut down

## 2019-06-11 ENCOUNTER — Emergency Department: Payer: Medicaid Other

## 2019-06-11 ENCOUNTER — Other Ambulatory Visit: Payer: Self-pay

## 2019-06-11 ENCOUNTER — Observation Stay: Admit: 2019-06-11 | Payer: Medicaid Other

## 2019-06-11 ENCOUNTER — Encounter: Payer: Self-pay | Admitting: Emergency Medicine

## 2019-06-11 ENCOUNTER — Observation Stay
Admission: EM | Admit: 2019-06-11 | Discharge: 2019-06-11 | Discharge status: Against Medical Advice | Payer: Medicaid Other | Attending: Internal Medicine | Admitting: Internal Medicine

## 2019-06-11 DIAGNOSIS — K219 Gastro-esophageal reflux disease without esophagitis: Secondary | ICD-10-CM | POA: Diagnosis not present

## 2019-06-11 DIAGNOSIS — F17210 Nicotine dependence, cigarettes, uncomplicated: Secondary | ICD-10-CM | POA: Insufficient documentation

## 2019-06-11 DIAGNOSIS — Z79899 Other long term (current) drug therapy: Secondary | ICD-10-CM | POA: Diagnosis not present

## 2019-06-11 DIAGNOSIS — J4521 Mild intermittent asthma with (acute) exacerbation: Secondary | ICD-10-CM | POA: Diagnosis not present

## 2019-06-11 DIAGNOSIS — Z20822 Contact with and (suspected) exposure to covid-19: Secondary | ICD-10-CM | POA: Diagnosis not present

## 2019-06-11 DIAGNOSIS — Z833 Family history of diabetes mellitus: Secondary | ICD-10-CM | POA: Diagnosis not present

## 2019-06-11 DIAGNOSIS — J45901 Unspecified asthma with (acute) exacerbation: Secondary | ICD-10-CM | POA: Diagnosis present

## 2019-06-11 DIAGNOSIS — I4510 Unspecified right bundle-branch block: Secondary | ICD-10-CM | POA: Insufficient documentation

## 2019-06-11 DIAGNOSIS — Z886 Allergy status to analgesic agent status: Secondary | ICD-10-CM | POA: Insufficient documentation

## 2019-06-11 DIAGNOSIS — H5462 Unqualified visual loss, left eye, normal vision right eye: Secondary | ICD-10-CM | POA: Diagnosis not present

## 2019-06-11 DIAGNOSIS — Z91030 Bee allergy status: Secondary | ICD-10-CM | POA: Insufficient documentation

## 2019-06-11 DIAGNOSIS — F329 Major depressive disorder, single episode, unspecified: Secondary | ICD-10-CM | POA: Diagnosis not present

## 2019-06-11 DIAGNOSIS — Z9049 Acquired absence of other specified parts of digestive tract: Secondary | ICD-10-CM | POA: Diagnosis not present

## 2019-06-11 DIAGNOSIS — I119 Hypertensive heart disease without heart failure: Secondary | ICD-10-CM | POA: Diagnosis not present

## 2019-06-11 DIAGNOSIS — J45909 Unspecified asthma, uncomplicated: Secondary | ICD-10-CM | POA: Diagnosis present

## 2019-06-11 DIAGNOSIS — F32A Depression, unspecified: Secondary | ICD-10-CM | POA: Diagnosis present

## 2019-06-11 DIAGNOSIS — Z6841 Body Mass Index (BMI) 40.0 and over, adult: Secondary | ICD-10-CM | POA: Diagnosis not present

## 2019-06-11 DIAGNOSIS — Z823 Family history of stroke: Secondary | ICD-10-CM | POA: Diagnosis not present

## 2019-06-11 LAB — CBC
HCT: 42.3 % (ref 36.0–46.0)
Hemoglobin: 13 g/dL (ref 12.0–15.0)
MCH: 31.4 pg (ref 26.0–34.0)
MCHC: 30.7 g/dL (ref 30.0–36.0)
MCV: 102.2 fL — ABNORMAL HIGH (ref 80.0–100.0)
Platelets: 275 10*3/uL (ref 150–400)
RBC: 4.14 MIL/uL (ref 3.87–5.11)
RDW: 15.4 % (ref 11.5–15.5)
WBC: 7.2 10*3/uL (ref 4.0–10.5)
nRBC: 0 % (ref 0.0–0.2)

## 2019-06-11 LAB — BASIC METABOLIC PANEL
Anion gap: 13 (ref 5–15)
BUN: 10 mg/dL (ref 6–20)
CO2: 27 mmol/L (ref 22–32)
Calcium: 8.7 mg/dL — ABNORMAL LOW (ref 8.9–10.3)
Chloride: 99 mmol/L (ref 98–111)
Creatinine, Ser: 0.54 mg/dL (ref 0.44–1.00)
GFR calc Af Amer: >60 mL/min (ref >60)
GFR calc non Af Amer: >60 mL/min (ref >60)
Glucose, Bld: 94 mg/dL (ref 70–99)
Potassium: 3.9 mmol/L (ref 3.5–5.1)
Sodium: 139 mmol/L (ref 135–145)

## 2019-06-11 LAB — BLOOD GAS, VENOUS
Acid-Base Excess: 0.8 mmol/L (ref 0.0–2.0)
Bicarbonate: 29.8 mmol/L — ABNORMAL HIGH (ref 20.0–28.0)
FIO2: 0.21
pCO2, Ven: 68 mmHg — ABNORMAL HIGH (ref 44.0–60.0)
pH, Ven: 7.25 (ref 7.250–7.430)

## 2019-06-11 LAB — SARS CORONAVIRUS 2 BY RT PCR (HOSPITAL ORDER, PERFORMED IN ~~LOC~~ HOSPITAL LAB): SARS Coronavirus 2: NEGATIVE

## 2019-06-11 LAB — TROPONIN I (HIGH SENSITIVITY)
Troponin I (High Sensitivity): 19 ng/L — ABNORMAL HIGH (ref <18)
Troponin I (High Sensitivity): 20 ng/L — ABNORMAL HIGH (ref <18)

## 2019-06-11 LAB — POCT PREGNANCY, URINE: Preg Test, Ur: NEGATIVE

## 2019-06-11 LAB — BRAIN NATRIURETIC PEPTIDE: B Natriuretic Peptide: 107.2 pg/mL — ABNORMAL HIGH (ref 0.0–100.0)

## 2019-06-11 MED ORDER — IPRATROPIUM-ALBUTEROL 0.5-2.5 (3) MG/3ML IN SOLN: 3.0000 mL | Freq: Four times a day (QID) | RESPIRATORY_TRACT | Status: DC | PRN

## 2019-06-11 MED ORDER — ONDANSETRON HCL 4 MG/2ML IJ SOLN: 4.0000 mg | Freq: Four times a day (QID) | INTRAMUSCULAR | Status: DC | PRN

## 2019-06-11 MED ORDER — IPRATROPIUM-ALBUTEROL 0.5-2.5 (3) MG/3ML IN SOLN
3.0000 mL | Freq: Once | RESPIRATORY_TRACT | Status: AC
  Administered 2019-06-11: 3 mL via RESPIRATORY_TRACT

## 2019-06-11 MED ORDER — PREDNISONE 20 MG PO TABS
40.0000 mg | ORAL_TABLET | Freq: Every day | ORAL | 0 refills | Status: AC
Start: 2019-06-11 — End: 2019-06-16

## 2019-06-11 MED ORDER — ALBUTEROL SULFATE HFA 108 (90 BASE) MCG/ACT IN AERS
2.0000 | INHALATION_SPRAY | RESPIRATORY_TRACT | 1 refills | Status: DC | PRN
Start: 2019-06-11 — End: 2023-05-15

## 2019-06-11 MED ORDER — IPRATROPIUM-ALBUTEROL 0.5-2.5 (3) MG/3ML IN SOLN
3.0000 mL | Freq: Once | RESPIRATORY_TRACT | Status: AC
  Administered 2019-06-11: 3 mL via RESPIRATORY_TRACT
  Filled 2019-06-11: qty 9

## 2019-06-11 MED ORDER — IOHEXOL 350 MG/ML SOLN
100.0000 mL | Freq: Once | INTRAVENOUS | Status: AC | PRN
  Administered 2019-06-11: 100 mL via INTRAVENOUS
  Filled 2019-06-11: qty 100

## 2019-06-11 MED ORDER — METHYLPREDNISOLONE SODIUM SUCC 125 MG IJ SOLR
125.0000 mg | Freq: Once | INTRAMUSCULAR | Status: AC
  Administered 2019-06-11: 125 mg via INTRAVENOUS
  Filled 2019-06-11: qty 2

## 2019-06-11 MED ORDER — METHYLPREDNISOLONE SODIUM SUCC 40 MG IJ SOLR: 40.0000 mg | Freq: Two times a day (BID) | INTRAMUSCULAR | Status: DC

## 2019-06-11 MED ORDER — ONDANSETRON HCL 4 MG PO TABS: 4.0000 mg | ORAL_TABLET | Freq: Four times a day (QID) | ORAL | Status: DC | PRN

## 2019-06-11 MED ORDER — ENOXAPARIN SODIUM 40 MG/0.4ML ~~LOC~~ SOLN: 40.0000 mg | SUBCUTANEOUS | Status: DC

## 2019-06-11 MED ORDER — SODIUM CHLORIDE 0.9 % IV SOLN: INTRAVENOUS | Status: DC

## 2019-06-11 MED ORDER — GUAIFENESIN ER 600 MG PO TB12: 1200.0000 mg | ORAL_TABLET | Freq: Two times a day (BID) | ORAL | Status: DC

## 2019-06-11 NOTE — ED Provider Notes (Signed)
[facility] Emergency Department Provider Note  ____________________________________________   First MD Initiated Contact with Patient 06/11/19 1226     (approximate)  I have reviewed the triage vital signs and the nursing notes.   HISTORY  Chief Complaint Shortness of Breath and Rash    HPI Carmen Gomez is a 39 y.o. female here with cough, shortness of breath, mild chest pain, as well as rash on her lower extremities.  Patient's primary complaint is progressively worsening cough, wheezing, and shortness of breath.  This is been ongoing for the last several days.  She said associated severe dyspnea with exertion and general fatigue.  She has been having headaches and has felt very tired throughout the day.  She is having difficulty sleeping due to this.  She also states that she has had a itchy, pruritic, bilateral lower extremity rash.  Denies any new exposures.  She has not been outside or in contact with grass or other allergens more than usual.  No new socks or detergents.  No other complaints.  She denies any fevers or chills.  No sputum production.  Her primary issue right now is shortness of breath as well as transient chest pain with coughing.  No sputum production.  Of note, she has recently run out of her inhalers.  Denies any specific alleviating factors.  Symptoms are worse with exertion.        Past Medical History:  Diagnosis Date  . Asthma    WELL CONTROLLED-NO INHALERS  . 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 Cholelithiasis   . Depression    NO MEDS  . Diplopia   . GERD (gastroesophageal reflux disease)    NO MEDS  . Hypertension   . Morbid obesity with BMI of 45.0-49.9, adult [facility])     Patient Active Problem List   Diagnosis Date Noted  . Depression 05/11/2019  . Cholelithiasis   . Asthma 08/29/2016  . Morbid obesity with BMI of 45.0-49.9, adult ([place]) 08/29/2016  . Diplopia 06/02/2013    Past Surgical History:    Procedure Laterality Date  . CHOLECYSTECTOMY N/A 10/25/2016   Procedure: LAPAROSCOPIC CHOLECYSTECTOMY;  Surgeon: Jules Husbands, MD;  Location: [facility] ORS;  Service: General;  Laterality: N/A;  . EYE SURGERY Left    DETACHED RETINA  . HERNIA REPAIR    . UMBILICAL HERNIA REPAIR N/A 10/25/2016   Procedure: HERNIA REPAIR UMBILICAL ADULT;  Surgeon: Jules Husbands, MD;  Location: [facility] ORS;  Service: General;  Laterality: N/A;    Prior to Admission medications   Not on File    Allergies Bee venom and Aspirin  Family History  Problem Relation Age of Onset  . Stroke Sister 68  . Diabetes Maternal Grandmother     Social History Social History   Tobacco Use  . Smoking status: Current Every Day Smoker    Packs/day: 0.50    Years: 18.00    Pack years: 9.00    Types: Cigarettes  . Smokeless tobacco: Never Used  Substance Use Topics  . Alcohol use: Yes    Comment: Approximately 12-18 Beers weekly  . Drug use: Yes    Types: Marijuana    Review of Systems  Review of Systems  Constitutional: Positive for fatigue. Negative for fever.  HENT: Negative for congestion and sore throat.   Eyes: Negative for visual disturbance.  Respiratory: Positive for cough, shortness of breath and wheezing.   Cardiovascular: Negative for chest pain.  Gastrointestinal: Negative for abdominal pain, diarrhea, nausea and vomiting.  Genitourinary: Negative for flank pain.  Musculoskeletal: Negative for back pain and neck pain.  Skin: Positive for rash. Negative for wound.  Neurological: Negative for weakness.  All other systems reviewed and are negative.    ____________________________________________  PHYSICAL EXAM:      VITAL SIGNS: ED Triage Vitals  Enc Vitals Group     BP 06/11/19 1008 (!) 134/91     Pulse Rate 06/11/19 1008 94     Resp 06/11/19 1008 20     Temp 06/11/19 1008 98.7 F (37.1 C)     Temp Source 06/11/19 1008 Oral     SpO2 06/11/19 1008 95 %     Weight 06/11/19 1009 300 lb  (136.1 kg)     Height 06/11/19 1008 5\' 9"  (1.753 m)     Head Circumference --      Peak Flow --      Pain Score 06/11/19 1008 8     Pain Loc --      Pain Edu? --      Excl. in GC? --      Physical Exam Vitals and nursing note reviewed.  Constitutional:      General: She is not in acute distress.    Appearance: She is well-developed.  HENT:     Head: Normocephalic and atraumatic.  Eyes:     Conjunctiva/sclera: Conjunctivae normal.  Cardiovascular:     Rate and Rhythm: Regular rhythm. Tachycardia present.     Heart sounds: Normal heart sounds. No murmur. No friction rub.  Pulmonary:     Effort: Pulmonary effort is normal. Tachypnea present. No respiratory distress.     Breath sounds: Examination of the left-upper field reveals wheezing. Examination of the right-middle field reveals wheezing. Examination of the left-middle field reveals wheezing. Examination of the right-lower field reveals wheezing. Examination of the left-lower field reveals wheezing. Decreased breath sounds and wheezing present. No rales.  Abdominal:     General: There is no distension.     Palpations: Abdomen is soft.     Tenderness: There is no abdominal tenderness.  Musculoskeletal:     Cervical back: Neck supple.     Right lower leg: No edema.     Left lower leg: No edema.  Skin:    General: Skin is warm.     Capillary Refill: Capillary refill takes less than 2 seconds.     Findings: Rash (Papular, mildly erythematous rash to bilateral lower extremities with slightly raised plaques with secondary skin excoriations and scaling) present.  Neurological:     Mental Status: She is alert and oriented to person, place, and time.     Motor: No abnormal muscle tone.       ____________________________________________   LABS (all labs ordered are listed, but only abnormal results are displayed)  Labs Reviewed  BASIC METABOLIC PANEL - Abnormal; Notable for the following components:      Result Value    Calcium 8.7 (*)    All other components within normal limits  CBC - Abnormal; Notable for the following components:   MCV 102.2 (*)    All other components within normal limits  BRAIN NATRIURETIC PEPTIDE - Abnormal; Notable for the following components:   B Natriuretic Peptide 107.2 (*)    All other components within normal limits  BLOOD GAS, VENOUS - Abnormal; Notable for the following components:   pCO2, Ven 68 (*)    Bicarbonate 29.8 (*)  All other components within normal limits  TROPONIN I (HIGH SENSITIVITY) - Abnormal; Notable for the following components:   Troponin I (High Sensitivity) 19 (*)    All other components within normal limits  TROPONIN I (HIGH SENSITIVITY) - Abnormal; Notable for the following components:   Troponin I (High Sensitivity) 20 (*)    All other components within normal limits  SARS CORONAVIRUS 2 BY RT PCR (HOSPITAL ORDER, PERFORMED IN [facility])  POC URINE PREG, ED  POCT PREGNANCY, URINE    ____________________________________________  EKG: Normal sinus rhythm, ventricular rate 95.  PR 120, QRS 142, QTc 573.  No acute ST elevations or depressions.  Right bundle branch block, which does appear new. ________________________________________  RADIOLOGY All imaging, including plain films, CT scans, and ultrasounds, independently reviewed by me, and interpretations confirmed via formal radiology reads.  ED MD interpretation:   CXR: Clear CT Angio: No PE, no PNA  Official radiology report(s): DG Chest 2 View  Result Date: 06/11/2019 CLINICAL DATA:  Chest pain and shortness of breath. EXAM: CHEST - 2 VIEW COMPARISON:  03/11/2018 FINDINGS: Midline trachea. Mild cardiomegaly. Mediastinal contours otherwise within normal limits. No pleural effusion or pneumothorax. Clear lungs. No congestive failure. Low lung volumes with resultant pulmonary interstitial prominence. IMPRESSION: Cardiomegaly, without acute disease. Electronically Signed   By:  Jeronimo Greaves M.D.   On: 06/11/2019 10:51   CT Angio Chest PE W and/or Wo Contrast  Result Date: 06/11/2019 CLINICAL DATA:  Shortness of breath EXAM: CT ANGIOGRAPHY CHEST WITH CONTRAST TECHNIQUE: Multidetector CT imaging of the chest was performed using the standard protocol during bolus administration of intravenous contrast. Multiplanar CT image reconstructions and MIPs were obtained to evaluate the vascular anatomy. CONTRAST:  OMNIPAQUE IOHEXOL 350 MG/ML SOLN COMPARISON:  None. FINDINGS: Cardiovascular: Satisfactory opacification of the pulmonary arteries to the segmental level. No evidence of pulmonary embolism. Cardiomegaly. No pericardial effusion. Mediastinum/Nodes: No enlarged mediastinal, hilar, or axillary lymph nodes. Thyroid gland, trachea, and esophagus demonstrate no significant findings. Lungs/Pleura: Lungs are clear. No pleural effusion or pneumothorax. Upper Abdomen: No acute abnormality. Musculoskeletal: No chest wall abnormality. No acute or significant osseous findings. Review of the MIP images confirms the above findings. IMPRESSION: 1. Negative examination for pulmonary embolism. 2. Cardiomegaly. Electronically Signed   By: Lauralyn Primes M.D.   On: 06/11/2019 14:46    ____________________________________________  PROCEDURES   Procedure(s) performed (including Critical Care):  Procedures  ____________________________________________  INITIAL IMPRESSION / MDM / ASSESSMENT AND PLAN / ED COURSE  As part of my medical decision making, I reviewed the following data within the electronic MEDICAL RECORD NUMBER Nursing notes reviewed and incorporated, Old chart reviewed, Notes from prior ED visits, and [place] Controlled Substance Database       *Carmen Gomez was evaluated in Emergency Department on 06/11/2019 for the symptoms described in the history of present illness. She was evaluated in the context of the global COVID-19 pandemic, which necessitated consideration that the  patient might be at risk for infection with the SARS-CoV-2 virus that causes COVID-19. Institutional protocols and algorithms that pertain to the evaluation of patients at risk for COVID-19 are in a state of rapid change based on information released by regulatory bodies including the CDC and federal and state organizations. These policies and algorithms were followed during the patient's care in the ED.  Some ED evaluations and interventions may be delayed as a result of limited staffing during the pandemic.*  Clinical Course as of Jun 11 1515  Wed Jun 11, 2019  1730 39 year old female with history of asthma here with significant shortness of breath.  On arrival, the patient appears to be in mild respiratory distress and reportedly was hypoxic with EMS.  She does have diffuse wheezing and decreased aeration.  Will plan to start breathing treatments, f/u CT Angio given mildly elevated trop w/ CP, and admit. EKG shows RBBB but no ischemia and pain is less c/w acute ischemia.   [CI]  1516 Patient remains approximately 90% on room air.  Her blood gas does show possible slight hypercapnia.  She appears to be increasingly alert after breathing treatments, do not feel she needs BiPAP.  However, given her borderline hypoxia and increased work of breathing with diffuse wheezing and difficulty managing her symptoms at home, will admit for further treatment.  Of note, troponin fairly stable at 19 then 20, no chest pain, doubt ACS.  CT angio obtained and shows no evidence of PE.   [CI]    Clinical Course User Index [CI] Shaune Pollack, MD    Medical Decision Making:  As above.  ____________________________________________  FINAL CLINICAL IMPRESSION(S) / ED DIAGNOSES  Final diagnoses:  Mild intermittent asthma with exacerbation     MEDICATIONS GIVEN DURING THIS VISIT:  Medications  methylPREDNISolone sodium succinate (SOLU-MEDROL) 125 mg/2 mL injection 125 mg (125 mg Intravenous Given 06/11/19 1256)    ipratropium-albuterol (DUONEB) 0.5-2.5 (3) MG/3ML nebulizer solution 3 mL (3 mLs Nebulization Given 06/11/19 1256)  ipratropium-albuterol (DUONEB) 0.5-2.5 (3) MG/3ML nebulizer solution 3 mL (3 mLs Nebulization Given 06/11/19 1256)  ipratropium-albuterol (DUONEB) 0.5-2.5 (3) MG/3ML nebulizer solution 3 mL (3 mLs Nebulization Given 06/11/19 1256)  iohexol (OMNIPAQUE) 350 MG/ML injection 100 mL (100 mLs Intravenous Contrast Given 06/11/19 1410)     ED Discharge Orders    None       Note:  This document was prepared using Dragon voice recognition software and may include unintentional dictation errors.   Shaune Pollack, MD 06/11/19 1517

## 2019-06-11 NOTE — ED Triage Notes (Signed)
Pt 95% RA in triage.  Pt remained on room air for entire triage and sats remained 94-95 RA.  Here for [facility] and chest pain along with rash to both legs.  Rash for one month. SHOB for 2 weeks with green productive cough.  No fevers.

## 2019-06-11 NOTE — ED Triage Notes (Signed)
First Nurse Note:  Patient arrives to the ED via EMS for shortness of breath.  Patient has history of asthma and has been out of her inhaler for "a long time".  Per EMS, patient's oxygen saturation was 89% on room air when the fire department arrived on scene.  They placed patient on 4L of oxygen via [place] and oxygen came up to the high 90s.  EMS lowered patient to 2L [place] and patient's oxygen saturation has remained above 94%.

## 2019-06-11 NOTE — Progress Notes (Signed)
Called by ER doc to admit patient for an acute asthma exacerbation.  Went to see patient in the emergency room who declines admission at this time and would rather go home.  Patient signed out AGAINST MEDICAL ADVICE.

## 2019-06-11 NOTE — ED Notes (Signed)
She has finished svn and is resting quietly.  I woke her to have her go to bathroom to get urine spec.  She ambulated with pulse ox on and maintained ox of 90-93%.

## 2019-06-11 NOTE — ED Notes (Signed)
Patient is continuing the long svn.

## 2019-06-11 NOTE — ED Notes (Signed)
Says short of breath, cough for about a week.  She is supposed to do inhaler, but has not been.  She had chest pain sharp upper mid chest that increases with deep breath or coughing.  She has a rash on both lower legs for about a month.

## 2020-11-01 ENCOUNTER — Emergency Department: Payer: Medicaid Other

## 2020-11-01 ENCOUNTER — Other Ambulatory Visit: Payer: Self-pay

## 2020-11-01 DIAGNOSIS — I248 Other forms of acute ischemic heart disease: Secondary | ICD-10-CM | POA: Diagnosis present

## 2020-11-01 DIAGNOSIS — Z9114 Patient's other noncompliance with medication regimen: Secondary | ICD-10-CM

## 2020-11-01 DIAGNOSIS — I161 Hypertensive emergency: Secondary | ICD-10-CM | POA: Diagnosis present

## 2020-11-01 DIAGNOSIS — F17210 Nicotine dependence, cigarettes, uncomplicated: Secondary | ICD-10-CM | POA: Diagnosis present

## 2020-11-01 DIAGNOSIS — J45909 Unspecified asthma, uncomplicated: Secondary | ICD-10-CM | POA: Diagnosis present

## 2020-11-01 DIAGNOSIS — R519 Headache, unspecified: Secondary | ICD-10-CM | POA: Diagnosis present

## 2020-11-01 DIAGNOSIS — Z20822 Contact with and (suspected) exposure to covid-19: Secondary | ICD-10-CM | POA: Diagnosis present

## 2020-11-01 DIAGNOSIS — Z823 Family history of stroke: Secondary | ICD-10-CM

## 2020-11-01 DIAGNOSIS — Z886 Allergy status to analgesic agent status: Secondary | ICD-10-CM

## 2020-11-01 DIAGNOSIS — F1010 Alcohol abuse, uncomplicated: Secondary | ICD-10-CM | POA: Diagnosis present

## 2020-11-01 DIAGNOSIS — F32A Depression, unspecified: Secondary | ICD-10-CM | POA: Diagnosis present

## 2020-11-01 DIAGNOSIS — Z833 Family history of diabetes mellitus: Secondary | ICD-10-CM

## 2020-11-01 DIAGNOSIS — I4510 Unspecified right bundle-branch block: Secondary | ICD-10-CM | POA: Diagnosis present

## 2020-11-01 DIAGNOSIS — K219 Gastro-esophageal reflux disease without esophagitis: Secondary | ICD-10-CM | POA: Diagnosis present

## 2020-11-01 DIAGNOSIS — I429 Cardiomyopathy, unspecified: Secondary | ICD-10-CM | POA: Diagnosis present

## 2020-11-01 DIAGNOSIS — Z91030 Bee allergy status: Secondary | ICD-10-CM

## 2020-11-01 DIAGNOSIS — H5462 Unqualified visual loss, left eye, normal vision right eye: Secondary | ICD-10-CM | POA: Diagnosis present

## 2020-11-01 DIAGNOSIS — Z6841 Body Mass Index (BMI) 40.0 and over, adult: Secondary | ICD-10-CM

## 2020-11-01 DIAGNOSIS — I110 Hypertensive heart disease with heart failure: Principal | ICD-10-CM | POA: Diagnosis present

## 2020-11-01 DIAGNOSIS — Z23 Encounter for immunization: Secondary | ICD-10-CM

## 2020-11-01 DIAGNOSIS — Z79899 Other long term (current) drug therapy: Secondary | ICD-10-CM

## 2020-11-01 DIAGNOSIS — I5021 Acute systolic (congestive) heart failure: Secondary | ICD-10-CM | POA: Diagnosis present

## 2020-11-01 DIAGNOSIS — Z91199 Patient's noncompliance with other medical treatment and regimen due to unspecified reason: Secondary | ICD-10-CM

## 2020-11-01 DIAGNOSIS — J9601 Acute respiratory failure with hypoxia: Secondary | ICD-10-CM | POA: Diagnosis present

## 2020-11-01 LAB — CBC
HCT: 39.9 % (ref 36.0–46.0)
Hemoglobin: 12.6 g/dL (ref 12.0–15.0)
MCH: 32.4 pg (ref 26.0–34.0)
MCHC: 31.6 g/dL (ref 30.0–36.0)
MCV: 102.6 fL — ABNORMAL HIGH (ref 80.0–100.0)
Platelets: 294 10*3/uL (ref 150–400)
RBC: 3.89 MIL/uL (ref 3.87–5.11)
RDW: 16.1 % — ABNORMAL HIGH (ref 11.5–15.5)
WBC: 5.3 10*3/uL (ref 4.0–10.5)
nRBC: 0 % (ref 0.0–0.2)

## 2020-11-01 LAB — BRAIN NATRIURETIC PEPTIDE: B Natriuretic Peptide: 330.2 pg/mL — ABNORMAL HIGH (ref 0.0–100.0)

## 2020-11-01 LAB — BASIC METABOLIC PANEL
Anion gap: 6 (ref 5–15)
BUN: 9 mg/dL (ref 6–20)
CO2: 33 mmol/L — ABNORMAL HIGH (ref 22–32)
Calcium: 8.4 mg/dL — ABNORMAL LOW (ref 8.9–10.3)
Chloride: 103 mmol/L (ref 98–111)
Creatinine, Ser: 0.67 mg/dL (ref 0.44–1.00)
GFR, Estimated: >60 mL/min (ref >60)
Glucose, Bld: 103 mg/dL — ABNORMAL HIGH (ref 70–99)
Potassium: 4.1 mmol/L (ref 3.5–5.1)
Sodium: 142 mmol/L (ref 135–145)

## 2020-11-01 LAB — TROPONIN I (HIGH SENSITIVITY)
Troponin I (High Sensitivity): 28 ng/L — ABNORMAL HIGH (ref <18)
Troponin I (High Sensitivity): 25 ng/L — ABNORMAL HIGH (ref <18)

## 2020-11-01 NOTE — ED Triage Notes (Signed)
C/O SOB/ Chest tightness.  Per EMs report, ekg shows right bundle branch block.  [facility]  Nebs given.  Initial RA stats in the 80's.  Placed on 4l/[place] -- sats 98%.    18g RAC.  1 NTG spray-- pain 7/10 - 5/10 187/111

## 2020-11-02 ENCOUNTER — Inpatient Hospital Stay
Admission: EM | Admit: 2020-11-02 | Discharge: 2020-11-04 | DRG: 291 | Disposition: A | Discharge status: Home / Self Care | Payer: Medicaid Other | Attending: Family Medicine | Admitting: Family Medicine

## 2020-11-02 ENCOUNTER — Encounter: Payer: Self-pay | Admitting: Internal Medicine

## 2020-11-02 ENCOUNTER — Inpatient Hospital Stay (HOSPITAL_COMMUNITY)
Admit: 2020-11-02 | Discharge: 2020-11-02 | Disposition: A | Discharge status: Home / Self Care | Payer: Medicaid Other | Attending: Internal Medicine | Admitting: Internal Medicine

## 2020-11-02 DIAGNOSIS — R609 Edema, unspecified: Secondary | ICD-10-CM

## 2020-11-02 DIAGNOSIS — I509 Heart failure, unspecified: Secondary | ICD-10-CM

## 2020-11-02 DIAGNOSIS — R0609 Other forms of dyspnea: Secondary | ICD-10-CM

## 2020-11-02 DIAGNOSIS — I10 Essential (primary) hypertension: Secondary | ICD-10-CM | POA: Diagnosis not present

## 2020-11-02 DIAGNOSIS — R519 Headache, unspecified: Secondary | ICD-10-CM | POA: Diagnosis present

## 2020-11-02 DIAGNOSIS — I248 Other forms of acute ischemic heart disease: Secondary | ICD-10-CM | POA: Diagnosis present

## 2020-11-02 DIAGNOSIS — Z23 Encounter for immunization: Secondary | ICD-10-CM | POA: Diagnosis not present

## 2020-11-02 DIAGNOSIS — I161 Hypertensive emergency: Secondary | ICD-10-CM | POA: Diagnosis present

## 2020-11-02 DIAGNOSIS — Z91199 Patient's noncompliance with other medical treatment and regimen due to unspecified reason: Secondary | ICD-10-CM | POA: Diagnosis not present

## 2020-11-02 DIAGNOSIS — Z20822 Contact with and (suspected) exposure to covid-19: Secondary | ICD-10-CM | POA: Diagnosis present

## 2020-11-02 DIAGNOSIS — Z9114 Patient's other noncompliance with medication regimen: Secondary | ICD-10-CM | POA: Diagnosis not present

## 2020-11-02 DIAGNOSIS — J45909 Unspecified asthma, uncomplicated: Secondary | ICD-10-CM | POA: Diagnosis present

## 2020-11-02 DIAGNOSIS — Z91030 Bee allergy status: Secondary | ICD-10-CM | POA: Diagnosis not present

## 2020-11-02 DIAGNOSIS — F1010 Alcohol abuse, uncomplicated: Secondary | ICD-10-CM | POA: Diagnosis present

## 2020-11-02 DIAGNOSIS — J9601 Acute respiratory failure with hypoxia: Secondary | ICD-10-CM | POA: Diagnosis present

## 2020-11-02 DIAGNOSIS — Z823 Family history of stroke: Secondary | ICD-10-CM | POA: Diagnosis not present

## 2020-11-02 DIAGNOSIS — I110 Hypertensive heart disease with heart failure: Secondary | ICD-10-CM | POA: Diagnosis present

## 2020-11-02 DIAGNOSIS — Z833 Family history of diabetes mellitus: Secondary | ICD-10-CM | POA: Diagnosis not present

## 2020-11-02 DIAGNOSIS — F17210 Nicotine dependence, cigarettes, uncomplicated: Secondary | ICD-10-CM | POA: Diagnosis present

## 2020-11-02 DIAGNOSIS — Z886 Allergy status to analgesic agent status: Secondary | ICD-10-CM | POA: Diagnosis not present

## 2020-11-02 DIAGNOSIS — R9431 Abnormal electrocardiogram [ECG] [EKG]: Secondary | ICD-10-CM | POA: Diagnosis not present

## 2020-11-02 DIAGNOSIS — F32A Depression, unspecified: Secondary | ICD-10-CM | POA: Diagnosis present

## 2020-11-02 DIAGNOSIS — R0902 Hypoxemia: Secondary | ICD-10-CM

## 2020-11-02 DIAGNOSIS — I5021 Acute systolic (congestive) heart failure: Secondary | ICD-10-CM | POA: Diagnosis present

## 2020-11-02 DIAGNOSIS — K219 Gastro-esophageal reflux disease without esophagitis: Secondary | ICD-10-CM | POA: Diagnosis present

## 2020-11-02 DIAGNOSIS — I429 Cardiomyopathy, unspecified: Secondary | ICD-10-CM | POA: Diagnosis present

## 2020-11-02 DIAGNOSIS — I4510 Unspecified right bundle-branch block: Secondary | ICD-10-CM | POA: Diagnosis present

## 2020-11-02 DIAGNOSIS — Z6841 Body Mass Index (BMI) 40.0 and over, adult: Secondary | ICD-10-CM | POA: Diagnosis not present

## 2020-11-02 DIAGNOSIS — H5462 Unqualified visual loss, left eye, normal vision right eye: Secondary | ICD-10-CM | POA: Diagnosis present

## 2020-11-02 LAB — ECHOCARDIOGRAM COMPLETE: S' Lateral: 3.83 cm

## 2020-11-02 LAB — CBC
HCT: 43.5 % (ref 36.0–46.0)
Hemoglobin: 13 g/dL (ref 12.0–15.0)
MCH: 30.9 pg (ref 26.0–34.0)
MCHC: 29.9 g/dL — ABNORMAL LOW (ref 30.0–36.0)
MCV: 103.3 fL — ABNORMAL HIGH (ref 80.0–100.0)
Platelets: 316 10*3/uL (ref 150–400)
RBC: 4.21 MIL/uL (ref 3.87–5.11)
RDW: 15.9 % — ABNORMAL HIGH (ref 11.5–15.5)
WBC: 6.2 10*3/uL (ref 4.0–10.5)
nRBC: 0 % (ref 0.0–0.2)

## 2020-11-02 LAB — RESP PANEL BY RT-PCR (FLU A&B, COVID) ARPGX2
Influenza A by PCR: NEGATIVE
Influenza B by PCR: NEGATIVE
SARS Coronavirus 2 by RT PCR: NEGATIVE

## 2020-11-02 LAB — CREATININE, SERUM
Creatinine, Ser: 0.62 mg/dL (ref 0.44–1.00)
GFR, Estimated: >60 mL/min (ref >60)

## 2020-11-02 LAB — LIPID PANEL
Cholesterol: 150 mg/dL (ref 0–200)
HDL: 33 mg/dL — ABNORMAL LOW (ref >40)
LDL Cholesterol: 67 mg/dL (ref 0–99)
Total CHOL/HDL Ratio: 4.5 RATIO
Triglycerides: 251 mg/dL — ABNORMAL HIGH (ref <150)
VLDL: 50 mg/dL — ABNORMAL HIGH (ref 0–40)

## 2020-11-02 LAB — TSH: TSH: 1.603 u[IU]/mL (ref 0.350–4.500)

## 2020-11-02 LAB — HEMOGLOBIN A1C
Hgb A1c MFr Bld: 5.9 % — ABNORMAL HIGH (ref 4.8–5.6)
Mean Plasma Glucose: 122.63 mg/dL

## 2020-11-02 LAB — HIV ANTIBODY (ROUTINE TESTING W REFLEX): HIV Screen 4th Generation wRfx: NONREACTIVE

## 2020-11-02 MED ORDER — INFLUENZA VAC SPLIT QUAD 0.5 ML IM SUSY
0.5000 mL | PREFILLED_SYRINGE | INTRAMUSCULAR | Status: AC
  Administered 2020-11-03: 0.5 mL via INTRAMUSCULAR
  Filled 2020-11-02: qty 0.5

## 2020-11-02 MED ORDER — HYDRALAZINE HCL 50 MG PO TABS
25.0000 mg | ORAL_TABLET | Freq: Four times a day (QID) | ORAL | Status: DC | PRN
  Administered 2020-11-03: 25 mg via ORAL
  Filled 2020-11-02: qty 1

## 2020-11-02 MED ORDER — LABETALOL HCL 5 MG/ML IV SOLN
10.0000 mg | Freq: Once | INTRAVENOUS | Status: AC
  Administered 2020-11-02: 10 mg via INTRAVENOUS
  Filled 2020-11-02: qty 4

## 2020-11-02 MED ORDER — NITROGLYCERIN 2 % TD OINT
1.0000 [in_us] | TOPICAL_OINTMENT | Freq: Once | TRANSDERMAL | Status: AC
  Administered 2020-11-02: 1 [in_us] via TOPICAL
  Filled 2020-11-02: qty 1

## 2020-11-02 MED ORDER — FUROSEMIDE 10 MG/ML IJ SOLN
60.0000 mg | Freq: Once | INTRAMUSCULAR | Status: AC
  Administered 2020-11-02: 60 mg via INTRAVENOUS
  Filled 2020-11-02: qty 8

## 2020-11-02 MED ORDER — ACETAMINOPHEN 325 MG PO TABS
650.0000 mg | ORAL_TABLET | Freq: Four times a day (QID) | ORAL | Status: DC | PRN
  Administered 2020-11-02 – 2020-11-03 (×2): 650 mg via ORAL
  Filled 2020-11-02 (×2): qty 2

## 2020-11-02 MED ORDER — ACETAMINOPHEN 650 MG RE SUPP: 650.0000 mg | Freq: Four times a day (QID) | RECTAL | Status: DC | PRN

## 2020-11-02 MED ORDER — ENOXAPARIN SODIUM 80 MG/0.8ML IJ SOSY
0.5000 mg/kg | PREFILLED_SYRINGE | INTRAMUSCULAR | Status: DC
Start: 2020-11-02 — End: 2020-11-04
  Administered 2020-11-02 – 2020-11-04 (×3): 67.5 mg via SUBCUTANEOUS
  Filled 2020-11-02 (×3): qty 0.68

## 2020-11-02 MED ORDER — ENOXAPARIN SODIUM 40 MG/0.4ML IJ SOSY: 40.0000 mg | PREFILLED_SYRINGE | INTRAMUSCULAR | Status: DC

## 2020-11-02 MED ORDER — NICOTINE 14 MG/24HR TD PT24
14.0000 mg | MEDICATED_PATCH | Freq: Every day | TRANSDERMAL | Status: DC
  Filled 2020-11-02 (×2): qty 1

## 2020-11-02 MED ORDER — FUROSEMIDE 10 MG/ML IJ SOLN
40.0000 mg | Freq: Two times a day (BID) | INTRAMUSCULAR | Status: DC
  Administered 2020-11-02 – 2020-11-03 (×3): 40 mg via INTRAVENOUS
  Filled 2020-11-02 (×3): qty 4

## 2020-11-02 MED ORDER — POLYETHYLENE GLYCOL 3350 17 G PO PACK: 17.0000 g | PACK | Freq: Every day | ORAL | Status: DC | PRN

## 2020-11-02 MED ORDER — ONDANSETRON HCL 4 MG/2ML IJ SOLN: 4.0000 mg | Freq: Four times a day (QID) | INTRAMUSCULAR | Status: DC | PRN

## 2020-11-02 MED ORDER — LOSARTAN POTASSIUM 50 MG PO TABS
50.0000 mg | ORAL_TABLET | Freq: Every day | ORAL | Status: DC
  Administered 2020-11-02 – 2020-11-04 (×3): 50 mg via ORAL
  Filled 2020-11-02 (×3): qty 1

## 2020-11-02 MED ORDER — ONDANSETRON HCL 4 MG PO TABS: 4.0000 mg | ORAL_TABLET | Freq: Four times a day (QID) | ORAL | Status: DC | PRN

## 2020-11-02 MED ORDER — ALBUTEROL SULFATE (2.5 MG/3ML) 0.083% IN NEBU: 2.5000 mg | INHALATION_SOLUTION | RESPIRATORY_TRACT | Status: DC | PRN

## 2020-11-02 NOTE — H&P (Signed)
Acute History and Physical    Carmen Gomez SWN:[id number] DOB: [birth year] DOA: 11/02/2020  PCP: [facility]  Patient coming from: Home/family care center via EMS  I have personally briefly reviewed patient's old medical records in [facility] Link  Chief Complaint: Shortness of breath, chest pain  HPI: Carmen Gomez is a 40 y.o. female with medical history significant of essential hypertension, morbid obesity, tobacco use disorder who presents to [facility] ED on 11/1 via EMS with complaints of shortness of breath, chest pain.  Patient reports onset roughly 1 week ago.  Patient has been noncompliant with home antihypertensives for roughly 1 year.  Continues with tobacco use.  Patient was being seen at the family care center complaining of chest tightness and shortness of breath and EMS was called.  On arrival, patient's oxygen saturation noted to be in the 80s and placed on 4 L nasal cannula.  ED Course: Temperature 98.2 F, HR 100, RR 18, BP 180/110, SPO2 97% on 4 L nasal cannula.  Sodium 142, potassium 4.1, chloride 103, CO2 33, BUN 9, creatinine 0.67.  Glucose 103.  WBC 5.3, hemoglobin 12.6, platelets 294.  BNP elevated 330.2.  High sensitive troponin 28>25.  Chest x-ray with enlarged cardiac silhouette, pulmonary vascular congestion.  COVID-19 PCR negative.  Influenza A/B PCR negative.  EKG personally reviewed with normal sinus rhythm, rate 99, QTc 580, RBBB, no concerning ST elevation/depression or T wave inversions.  Patient was given 10 mg IV labetalol, inch Nitropaste, and IV Lasix by EDP.  Hospital service consulted for further evaluation and management of acute respite failure secondary to hypertensive emergency, CHF exacerbation.  Review of Systems:  Constitutional - No Fatigue, No Weight Loss Vision - No impaired vision, decreased visual acuity Ear/Nose/Mouth/Throat - No decreased hearing, no congestion Respiratory - + shortness of breath, no exertional  dyspnea, chronic cough Cardiovascular - + chest pain, no palpitations, no peripheral edema Gastrointestinal - No nausea, no diarrhea, no constipation, Genitourinary - No excessive urination, no urinary incontinence Integumentary - No rashes or concerning skin lesions Neurologic - No numbness, no tingling, no dizziness, no headaches, no confusion or memory loss   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   Cholelithiasis    Depression    NO MEDS   Diplopia    GERD (gastroesophageal reflux disease)    NO MEDS   Hypertension    Morbid obesity with BMI of 45.0-49.9, adult [facility])     Past Surgical History:  Procedure Laterality Date   CHOLECYSTECTOMY N/A 10/25/2016   Procedure: LAPAROSCOPIC CHOLECYSTECTOMY;  Surgeon: Leafy Ro, MD;  Location: [facility] ORS;  Service: General;  Laterality: N/A;   EYE SURGERY Left    DETACHED RETINA   HERNIA REPAIR     UMBILICAL HERNIA REPAIR N/A 10/25/2016   Procedure: HERNIA REPAIR UMBILICAL ADULT;  Surgeon: Leafy Ro, MD;  Location: [facility] ORS;  Service: General;  Laterality: N/A;     reports that she has been smoking cigarettes. She has a 9.00 pack-year smoking history. She has never used smokeless tobacco. She reports current alcohol use. She reports current drug use. Drug: Marijuana.  Allergies  Allergen Reactions   Bee Venom Swelling   Aspirin Nausea And Vomiting    Family History  Problem Relation Age of Onset   Stroke Sister 22   Diabetes Maternal Grandmother  Family history reviewed and not pertinent   Prior to Admission medications   Medication Sig Start Date End Date Taking? Authorizing Provider  albuterol (VENTOLIN HFA) 108 (90 Base) MCG/ACT inhaler Inhale 2 puffs into the lungs every 4 (four) hours as needed for wheezing or shortness of breath. 06/11/19   Shaune Pollack, MD    Physical Exam: Vitals:   11/01/20 1241 11/01/20 1715 11/01/20  2253 11/02/20 0338  BP: (!) 156/111 (!) 153/119 (!) 164/123 (!) 147/106  Pulse: 100 92 93 86  Resp: 18 18 16 18   Temp: 98.2 F (36.8 C) 98 F (36.7 C) 98.5 F (36.9 C) 98.1 F (36.7 C)  TempSrc: Oral Oral Oral Oral  SpO2: 97% 100% 100% 96%  Weight:      Height:        Constitutional: NAD, calm, comfortable, obese, appears older than stated age, chronically ill in appearance Eyes: PERRL, lids and conjunctivae normal ENMT: Mucous membranes are moist. Posterior pharynx clear of any exudate or lesions.Normal dentition.  Neck: normal, supple, no masses, no thyromegaly Respiratory: Decreased breath sounds bilateral bases with crackles, normal respiratory effort without accessory muscle use, no wheezing, on 2 L nasal cannula with SPO2 98%.  Cardiovascular: Regular rate and rhythm, no murmurs / rubs / gallops.  2-3+ lower extremity edema pitting bilaterally up to upper shins. 2+ pedal pulses. No carotid bruits.  Abdomen: no tenderness, no masses palpated. No hepatosplenomegaly. Bowel sounds positive.  Musculoskeletal: no clubbing / cyanosis. No joint deformity upper and lower extremities. Good ROM, no contractures. Normal muscle tone.  Skin: no rashes, lesions, ulcers. No induration Neurologic: CN 2-12 grossly intact. Sensation intact, DTR normal. Strength 5/5 in all 4.  Psychiatric: Poor judgment and insight. Alert and oriented x 3. Normal mood.    Labs on Admission: I have personally reviewed following labs and imaging studies  CBC: Recent Labs  Lab 11/01/20 1242  WBC 5.3  HGB 12.6  HCT 39.9  MCV 102.6*  PLT 294   Basic Metabolic Panel: Recent Labs  Lab 11/01/20 1242  NA 142  K 4.1  CL 103  CO2 33*  GLUCOSE 103*  BUN 9  CREATININE 0.67  CALCIUM 8.4*   GFR: Estimated Creatinine Clearance: 140.4 mL/min (by C-G formula based on SCr of 0.67 mg/dL). Liver Function Tests: No results for input(s): AST, ALT, ALKPHOS, BILITOT, PROT, ALBUMIN in the last 168 hours. No results  for input(s): LIPASE, AMYLASE in the last 168 hours. No results for input(s): AMMONIA in the last 168 hours. Coagulation Profile: No results for input(s): INR, PROTIME in the last 168 hours. Cardiac Enzymes: No results for input(s): CKTOTAL, CKMB, CKMBINDEX, TROPONINI in the last 168 hours. BNP (last 3 results) No results for input(s): PROBNP in the last 8760 hours. HbA1C: No results for input(s): HGBA1C in the last 72 hours. CBG: No results for input(s): GLUCAP in the last 168 hours. Lipid Profile: No results for input(s): CHOL, HDL, LDLCALC, TRIG, CHOLHDL, LDLDIRECT in the last 72 hours. Thyroid Function Tests: No results for input(s): TSH, T4TOTAL, FREET4, T3FREE, THYROIDAB in the last 72 hours. Anemia Panel: No results for input(s): VITAMINB12, FOLATE, FERRITIN, TIBC, IRON, RETICCTPCT in the last 72 hours. Urine analysis:    Component Value Date/Time   COLORURINE YELLOW (A) 08/13/2016 0538   APPEARANCEUR CLEAR (A) 08/13/2016 0538   LABSPEC 1.006 08/13/2016 0538   PHURINE 5.0 08/13/2016 0538   GLUCOSEU NEGATIVE 08/13/2016 0538   HGBUR NEGATIVE 08/13/2016 0538   BILIRUBINUR NEGATIVE 08/13/2016 5520  KETONESUR 5 (A) 08/13/2016 0538   PROTEINUR NEGATIVE 08/13/2016 0538   NITRITE NEGATIVE 08/13/2016 0538   LEUKOCYTESUR NEGATIVE 08/13/2016 0538    Radiological Exams on Admission: DG Chest 2 View  Result Date: 11/01/2020 CLINICAL DATA:  chest pain EXAM: CHEST - 2 VIEW COMPARISON:  07/11/2019. FINDINGS: Moderate enlargement the cardiac silhouette, which appears mildly increased from the prior. Pulmonary vascular congestion. No consolidation. No visible pleural effusions or pneumothorax. IMPRESSION: 1. Moderate enlargement of the cardiac silhouette, which appears mildly increased from the prior. Echocardiogram could better evaluate for underlying pericardial effusion if clinically indicated. 2. Pulmonary vascular congestion without overt pulmonary edema. Electronically Signed   By:  Feliberto Harts M.D.   On: 11/01/2020 13:34    EKG: Independently reviewed.   Assessment/Plan Active Problems:   Asthma   Morbid obesity with BMI of 45.0-49.9, adult (HCC)   Hypertensive emergency   Acute hypoxemic respiratory failure (HCC)   Acute CHF (congestive heart failure) (HCC)   Prolonged QT interval    Acute hypoxic respiratory failure, POA Patient presenting to ED via EMS from family care center after complaining of shortness of breath, chest pain and found to be hypoxic with SPO2 80% on room air.  Was placed on 4 L nasal cannula via EMS.  Etiology likely secondary to congestive heart failure exacerbation in the setting of medical noncompliance in terms of her antihypertensive use outpatient. --Continue supplemental oxygen, maintain SPO2 greater than 92%; on 2 L nasal cannula --Treatment as below  Congestive heart failure exacerbation, undifferentiated Hypertensive emergency On arrival to ED, patient's blood pressure noted to be elevated 180/110.  Patient states has been off of her antihypertensives for 1 year.  BNP elevated 330.2.  Chest x-ray with cardiac enlargement, pulmonary vascular congestion.  Physical exam findings of 2-3+ pitting edema bilateral lower extremities. --Admit inpatient, telemetry --Echocardiogram --Fluid restriction 1800 mL/day --Start losartan 50 mg p.o. daily --Hydralazine 25 mg p.o. q6h PRN SBP >170 or DBP >110 --Furosemide 40 mg IV every 12 hours --Strict I's and O's and daily weights  Elevated troponin High sensitive troponin elevated 28 followed by 25, flat.  EKG with normal sinus rhythm with no dynamic changes.  Etiology likely secondary to type II demand ischemia in the setting of hypertensive emergency and congestive heart failure exacerbation. --Treatment as above --Monitor on telemetry  Tobacco use disorder Patient continues to endorse half pack per day.  Counseled on need for complete cessation. --Nicotine patch  QTC  prolongation EKG on admission with prolonged QTC of 580. --Avoid QTC prolonging medications --Repeat EKG in the a.m.  Morbid obesity Body mass index is 44.31 kg/m.  Discussed with patient needs for aggressive lifestyle changes/weight loss as this complicates all facets of care.  Outpatient follow-up with PCP.  May benefit from bariatric evaluation outpatient.    DVT prophylaxis:   Lovenox Code Status: Full code Family Communication: Updated family present at bedside Disposition Plan: Anticipate discharge home when medically stable Consults called: None Admission status: Inpatient Level of care: Med-Surg   Severity of Illness: The appropriate patient status for this patient is INPATIENT. Inpatient status is judged to be reasonable and necessary in order to provide the required intensity of service to ensure the patient's safety. The patient's presenting symptoms, physical exam findings, and initial radiographic and laboratory data in the context of their chronic comorbidities is felt to place them at high risk for further clinical deterioration. Furthermore, it is not anticipated that the patient will be medically stable for discharge  from the hospital within 2 midnights of admission. The following factors support the patient status of inpatient.   " The patient's presenting symptoms include shortness of breath, chest pain " The worrisome physical exam findings include hypoxia on room air, crackles on lung exam " The initial radiographic and laboratory data are worrisome because of elevated BNP, chest x-ray with cardiac enlargement and pulmonary vascular congestion " The chronic co-morbidities include tobacco abuse, essential hypertension, medical noncompliance, morbid obesity.   * I certify that at the point of admission it is my clinical judgment that the patient will require inpatient hospital care spanning beyond 2 midnights from the point of admission due to high intensity of  service, high risk for further deterioration and high frequency of surveillance required.*    Alvira Philips [place] DO [facility] Available via Epic secure chat 7am-7pm After these hours, please refer to coverage provider listed on [email or web address] 11/02/2020, 10:48 AM

## 2020-11-02 NOTE — ED Provider Notes (Signed)
[facility] Emergency Department Provider Note  Time seen: 8:42 AM  I have reviewed the triage vital signs and the nursing notes.   HISTORY  Chief Complaint Chest Pain   HPI Carmen Gomez is a 40 y.o. female with a past medical history of hypertension, off medications x1 year, presents to the emergency department for shortness of breath and chest pain.  According to the patient over the past several days she has had worsening shortness of breath, states several weeks of lower extremity edema, now with chest pain.  Upon arrival patient noted to be considerably hypertensive 187/110 currently 160/121.  Patient states a history of hypertension but stopped taking her blood pressure medications approximately 1 year ago.  Patient states mild chest pain currently.  Shortness of breath improved on oxygen.   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   Cholelithiasis    Depression    NO MEDS   Diplopia    GERD (gastroesophageal reflux disease)    NO MEDS   Hypertension    Morbid obesity with BMI of 45.0-49.9, adult [facility])     Patient Active Problem List   Diagnosis Date Noted   Acute asthma exacerbation 06/11/2019   Depression 05/11/2019   Cholelithiasis    Asthma 08/29/2016   Morbid obesity with BMI of 45.0-49.9, adult (HCC) 08/29/2016   Diplopia 06/02/2013    Past Surgical History:  Procedure Laterality Date   CHOLECYSTECTOMY N/A 10/25/2016   Procedure: LAPAROSCOPIC CHOLECYSTECTOMY;  Surgeon: Leafy Ro, MD;  Location: [facility] ORS;  Service: General;  Laterality: N/A;   EYE SURGERY Left    DETACHED RETINA   HERNIA REPAIR     UMBILICAL HERNIA REPAIR N/A 10/25/2016   Procedure: HERNIA REPAIR UMBILICAL ADULT;  Surgeon: Leafy Ro, MD;  Location: [facility] ORS;  Service: General;  Laterality: N/A;    Prior to Admission medications   Medication Sig Start Date End Date  Taking? Authorizing Provider  albuterol (VENTOLIN HFA) 108 (90 Base) MCG/ACT inhaler Inhale 2 puffs into the lungs every 4 (four) hours as needed for wheezing or shortness of breath. 06/11/19   Shaune Pollack, MD    Allergies  Allergen Reactions   Bee Venom Swelling   Aspirin Nausea And Vomiting    Family History  Problem Relation Age of Onset   Stroke Sister 32   Diabetes Maternal Grandmother     Social History Social History   Tobacco Use   Smoking status: Every Day    Packs/day: 0.50    Years: 18.00    Pack years: 9.00    Types: Cigarettes   Smokeless tobacco: Never  Vaping Use   Vaping Use: Never used  Substance Use Topics   Alcohol use: Yes    Comment: Approximately 12-18 Beers weekly   Drug use: Yes    Types: Marijuana    Review of Systems Constitutional: Negative for fever. Cardiovascular: Intermittent chest pain over the past several days Respiratory: Worsening shortness of breath over the past week or 2, acutely worse over the past 2 days Gastrointestinal: Negative for abdominal pain, vomiting  Musculoskeletal: Increased lower extremity over the past several weeks Skin: Negative for skin complaints  Neurological: Negative for headache All other ROS negative  ____________________________________________   PHYSICAL EXAM:  VITAL SIGNS: ED Triage Vitals  Enc Vitals Group     BP 11/01/20 1241 (!) 156/111  Pulse Rate 11/01/20 1241 100     Resp 11/01/20 1241 18     Temp 11/01/20 1241 98.2 F (36.8 C)     Temp Source 11/01/20 1241 Oral     SpO2 11/01/20 1241 97 %     Weight 11/01/20 1231 (!) 300 lb 0.7 oz (136.1 kg)     Height 11/01/20 1231 5\' 9"  (1.753 m)     Head Circumference --      Peak Flow --      Pain Score 11/01/20 1230 6     Pain Loc --      Pain Edu? --      Excl. in GC? --    Constitutional: Alert and oriented. Well appearing and in no distress. Eyes: Normal exam ENT      Head: Normocephalic and atraumatic.      Mouth/Throat:  Mucous membranes are moist. Cardiovascular: Normal rate, regular rhythm.  Respiratory: Normal respiratory effort without tachypnea nor retractions. Breath sounds are clear  Gastrointestinal: Soft and nontender. No distention.  Musculoskeletal: Nontender with normal range of motion in all extremities.  1+ lower extreme edema bilaterally. Neurologic:  Normal speech and language. No gross focal neurologic deficits  Skin:  Skin is warm, dry and intact.  Psychiatric: Mood and affect are normal.   ____________________________________________    EKG  EKG viewed and interpreted by myself shows a normal sinus rhythm at 99 bpm with a widened QRS, normal axis, slight QTC prolongation otherwise normal intervals with nonspecific ST changes.  ____________________________________________    RADIOLOGY  Chest x-ray shows vascular congestion with cardiomegaly.  ____________________________________________   INITIAL IMPRESSION / ASSESSMENT AND PLAN / ED COURSE  Pertinent labs & imaging results that were available during my care of the patient were reviewed by me and considered in my medical decision making (see chart for details).   Patient presents to the emergency department for shortness of breath and chest pain as well as lower extremity edema.  Patient is considerably hypertensive 180/110 on arrival.  Currently 165/121.  Given the elevated diastolic pressure this is likely leading to vascular congestion/pulmonary edema.  Patient is hypoxic to 87-88% on room air, placed on 2 L nasal cannula and satting well.  Given the patient's hypertension we will dose 10 mg of IV labetalol as well as 1 inch of nitroglycerin ointment.  We will dose 60 mg of IV Lasix.  Patient will require admission to the hospital service for further work-up and treatment.  Troponin is slightly elevated, but unchanged from historical values  BRENDIA DAMPIER was evaluated in Emergency Department on 11/02/2020 for the symptoms  described in the history of present illness. She was evaluated in the context of the global COVID-19 pandemic, which necessitated consideration that the patient might be at risk for infection with the SARS-CoV-2 virus that causes COVID-19. Institutional protocols and algorithms that pertain to the evaluation of patients at risk for COVID-19 are in a state of rapid change based on information released by regulatory bodies including the CDC and federal and state organizations. These policies and algorithms were followed during the patient's care in the ED.  ____________________________________________   FINAL CLINICAL IMPRESSION(S) / ED DIAGNOSES  CHF exacerbation   13/01/2020, MD 11/02/20 [id number]

## 2020-11-02 NOTE — Progress Notes (Signed)
PHARMACIST - PHYSICIAN COMMUNICATION  CONCERNING:  Enoxaparin (Lovenox) for DVT Prophylaxis    RECOMMENDATION: Patient was prescribed enoxaprin 40mg  q24 hours for VTE prophylaxis.   Filed Weights   11/01/20 1231  Weight: (!) 136.1 kg (300 lb 0.7 oz)    Body mass index is 44.31 kg/m.  Estimated Creatinine Clearance: 140.4 mL/min (by C-G formula based on SCr of 0.67 mg/dL).   Based on [facility] policy patient is candidate for enoxaparin 0.5mg /kg TBW SQ every 24 hours based on BMI being >30.   DESCRIPTION: Pharmacy has adjusted enoxaparin dose per [facility] policy.  Patient is now receiving enoxaparin 67.5 mg every 24 hours    [facility], PharmD Clinical Pharmacist  11/02/2020 10:28 AM

## 2020-11-02 NOTE — Progress Notes (Signed)
*  PRELIMINARY RESULTS* Echocardiogram 2D Echocardiogram has been performed.  Carmen Gomez 11/02/2020, 1:56 PM

## 2020-11-03 ENCOUNTER — Inpatient Hospital Stay: Payer: Medicaid Other

## 2020-11-03 DIAGNOSIS — I10 Essential (primary) hypertension: Secondary | ICD-10-CM

## 2020-11-03 DIAGNOSIS — I509 Heart failure, unspecified: Secondary | ICD-10-CM | POA: Diagnosis not present

## 2020-11-03 LAB — D-DIMER, QUANTITATIVE: D-Dimer, Quant: 0.57 ug/mL-FEU — ABNORMAL HIGH (ref 0.00–0.50)

## 2020-11-03 LAB — BASIC METABOLIC PANEL
Anion gap: 6 (ref 5–15)
BUN: 11 mg/dL (ref 6–20)
CO2: 38 mmol/L — ABNORMAL HIGH (ref 22–32)
Calcium: 8.8 mg/dL — ABNORMAL LOW (ref 8.9–10.3)
Chloride: 97 mmol/L — ABNORMAL LOW (ref 98–111)
Creatinine, Ser: 0.77 mg/dL (ref 0.44–1.00)
GFR, Estimated: >60 mL/min (ref >60)
Glucose, Bld: 104 mg/dL — ABNORMAL HIGH (ref 70–99)
Potassium: 3.5 mmol/L (ref 3.5–5.1)
Sodium: 141 mmol/L (ref 135–145)

## 2020-11-03 LAB — MAGNESIUM: Magnesium: 1.9 mg/dL (ref 1.7–2.4)

## 2020-11-03 MED ORDER — IOHEXOL 350 MG/ML SOLN
100.0000 mL | Freq: Once | INTRAVENOUS | Status: AC | PRN
  Administered 2020-11-03: 100 mL via INTRAVENOUS

## 2020-11-03 MED ORDER — LORAZEPAM 1 MG PO TABS: 1.0000 mg | ORAL_TABLET | ORAL | Status: DC | PRN

## 2020-11-03 MED ORDER — LORAZEPAM 2 MG/ML IJ SOLN: 1.0000 mg | INTRAMUSCULAR | Status: DC | PRN

## 2020-11-03 MED ORDER — ADULT MULTIVITAMIN W/MINERALS CH
1.0000 | ORAL_TABLET | Freq: Every day | ORAL | Status: DC
  Administered 2020-11-03 – 2020-11-04 (×2): 1 via ORAL
  Filled 2020-11-03: qty 1

## 2020-11-03 MED ORDER — FOLIC ACID 1 MG PO TABS
1.0000 mg | ORAL_TABLET | Freq: Every day | ORAL | Status: DC
  Administered 2020-11-03 – 2020-11-04 (×2): 1 mg via ORAL
  Filled 2020-11-03: qty 1

## 2020-11-03 MED ORDER — FUROSEMIDE 40 MG PO TABS
40.0000 mg | ORAL_TABLET | Freq: Every day | ORAL | Status: DC
  Administered 2020-11-04: 09:00:00 40 mg via ORAL
  Filled 2020-11-03: qty 1

## 2020-11-03 MED ORDER — OXYCODONE HCL 5 MG PO TABS
5.0000 mg | ORAL_TABLET | ORAL | Status: DC | PRN
  Administered 2020-11-03: 5 mg via ORAL
  Filled 2020-11-03: qty 1

## 2020-11-03 MED ORDER — METOPROLOL SUCCINATE ER 25 MG PO TB24
25.0000 mg | ORAL_TABLET | Freq: Every day | ORAL | Status: DC
  Administered 2020-11-03 – 2020-11-04 (×2): 25 mg via ORAL
  Filled 2020-11-03 (×2): qty 1

## 2020-11-03 MED ORDER — THIAMINE HCL 100 MG PO TABS
100.0000 mg | ORAL_TABLET | Freq: Every day | ORAL | Status: DC
  Administered 2020-11-03 – 2020-11-04 (×2): 100 mg via ORAL
  Filled 2020-11-03: qty 1

## 2020-11-03 MED ORDER — THIAMINE HCL 100 MG/ML IJ SOLN: 100.0000 mg | Freq: Every day | INTRAMUSCULAR | Status: DC

## 2020-11-03 NOTE — Progress Notes (Signed)
PROGRESS NOTE    Carmen Gomez  HAL:[id number] DOB: 1980/10/22 DOA: 11/02/2020 PCP: [facility]    Chief Complaint  Patient presents with   Chest Pain    Brief Narrative:   Carmen Gomez is Carmen Gomez 40 y.o. female with medical history significant of essential hypertension, morbid obesity, tobacco use disorder who presents to [facility] ED on 11/1 via EMS with complaints of shortness of breath, chest pain.  Patient reports onset roughly 1 week ago.  Patient has been noncompliant with home antihypertensives for roughly 1 year.  Continues with tobacco use.  Patient was being seen at the family care center complaining of chest tightness and shortness of breath and EMS was called.  On arrival, patient's oxygen saturation noted to be in the 80s and placed on 4 L nasal cannula.   ED Course: Temperature 98.2 F, HR 100, RR 18, BP 180/110, SPO2 97% on 4 L nasal cannula.  Sodium 142, potassium 4.1, chloride 103, CO2 33, BUN 9, creatinine 0.67.  Glucose 103.  WBC 5.3, hemoglobin 12.6, platelets 294.  BNP elevated 330.2.  High sensitive troponin 28>25.  Chest x-ray with enlarged cardiac silhouette, pulmonary vascular congestion.  COVID-19 PCR negative.  Influenza Carmen Gomez/B PCR negative.  EKG personally reviewed with normal sinus rhythm, rate 99, QTc 580, RBBB, no concerning ST elevation/depression or T wave inversions.  Patient was given 10 mg IV labetalol, inch Nitropaste, and IV Lasix by EDP.  Hospital service consulted for further evaluation and management of acute respite failure secondary to hypertensive emergency, CHF exacerbation.  Assessment & Plan:   Active Problems:   Asthma   Morbid obesity with BMI of 45.0-49.9, adult (HCC)   Hypertensive emergency   Acute hypoxemic respiratory failure (HCC)   Acute CHF (congestive heart failure) (HCC)   Prolonged QT interval  Heart Failure with Reduced Ejection Fraction with Exacerbation Hypertensive Emergency Echo with EF  45-50% Cardiology c/s CXR with enlargement of cardiac silhouette, increased from prior CT PE protocol without PE, cardiomegaly, mild bibasilar scarring and/or atelectasis LE [place] without DVT Cardiology recommending metoprolol 25 mg daily, losartan - transition to PO lasix in AM BP improving, follow Recommending abstinence from etoh Follow up outpatient with cardiology  Acute Hypoxic Respiratory Failure Improved after diuresis, follow  Chest Pain  Elevated Troponin Troponin elevation flat, suspect demand CP is atypical and chronic Cardiology not recommending any inpatient ischemic eval at this point  Headache Follow off nitro and with improved BP control Consider head CT if persistent  Qtc Prolongatino Repeat in AM Caution with qtc prolonging meds Follow mag  Etoh Abuse [place]  Tobacco Use disorder Encoruage cessation  Obesity Body mass index is 44.31 kg/m.   DVT prophylaxis: (lovenox Code Status: fuill  Family Communication: none at bedside Disposition:   Status is: Inpatient  Remains inpatient appropriate because: continued need for diuresis and cardiology evaluation       Consultants:  cardiology  Procedures: LE US IMPRESSION: No evidence of deep venous thrombosis in either lower extremity.  Echo IMPRESSIONS     1. Left ventricular ejection fraction, by estimation, is 45 to 50%. The  left ventricle has mildly decreased function. Left ventricular endocardial  border not optimally defined to evaluate regional wall motion. The left  ventricular internal cavity size  was mildly dilated. There is mild left ventricular hypertrophy. Left  ventricular diastolic function could not be evaluated.   2. Right ventricular systolic function is mildly reduced. The right  ventricular size is  normal. Tricuspid regurgitation signal is inadequate  for assessing PA pressure.   3. The mitral valve is abnormal. Trivial mitral valve regurgitation.   4. The aortic valve  is tricuspid. Aortic valve regurgitation not well  assessed.   Antimicrobials:  Anti-infectives (From admission, onward)    None          Subjective: C/o chronic CP Chronic headache as well  Objective: Vitals:   11/03/20 0030 11/03/20 0607 11/03/20 0838 11/03/20 1126  BP: (!) 151/96 (!) 154/107 (!) 142/98 (!) 138/108  Pulse: 95 98 99 94  Resp: Temp: 98.8 F (37.1 C) (!) 97.5 F (36.4 C) 98.6 F (37 C) 97.9 F (36.6 C)  TempSrc: Oral  Oral Oral  SpO2: 100% 93% 94% 96%  Weight:      Height:        Intake/Output Summary (Last 24 hours) at 11/03/2020 1741 Last data filed at 11/03/2020 1426 Gross per 24 hour  Intake 830 ml  Output --  Net 830 ml   Filed Weights   11/01/20 1231  Weight: (!) 136.1 kg    Examination:  General exam: Appears calm and comfortable  Respiratory system: Clear to auscultation. Respiratory effort normal. Cardiovascular system: S1 & S2 heard, RRR. Gastrointestinal system: Abdomen is mildly distended and nontender.  Central nervous system: Alert and oriented. No focal neurological deficits. Extremities: bilateral LE edema Skin: No rashes, lesions or ulcers Psychiatry: Judgement and insight appear normal. Mood & affect appropriate.     Data Reviewed: I have personally reviewed following labs and imaging studies  CBC: Recent Labs  Lab 11/01/20 1242 11/02/20 1516  WBC 5.3 6.2  HGB 12.6 13.0  HCT 39.9 43.5  MCV 102.6* 103.3*  PLT 294 316    Basic Metabolic Panel: Recent Labs  Lab 11/01/20 1242 11/02/20 1516 11/03/20 0501  NA 142  --  141  K 4.1  --  3.5  CL 103  --  97*  CO2 33*  --  38*  GLUCOSE 103*  --  104*  BUN 9  --  11  CREATININE 0.67 0.62 0.77  CALCIUM 8.4*  --  8.8*  MG  --   --  1.9    GFR: Estimated Creatinine Clearance: 140.4 mL/min (by C-G formula based on SCr of 0.77 mg/dL).  Liver Function Tests: No results for input(s): AST, ALT, ALKPHOS, BILITOT, PROT, ALBUMIN in the last 168  hours.  CBG: No results for input(s): GLUCAP in the last 168 hours.   Recent Results (from the past 240 hour(s))  Resp Panel by RT-PCR (Flu Carmen Gomez&B, Covid) Nasopharyngeal Swab     Status: None   Collection Time: 11/02/20  8:40 AM   Specimen: Nasopharyngeal Swab; Nasopharyngeal(NP) swabs in vial transport medium  Result Value Ref Range Status   SARS Coronavirus 2 by RT PCR NEGATIVE NEGATIVE Final    Comment: (NOTE) SARS-CoV-2 target nucleic acids are NOT DETECTED.  The SARS-CoV-2 RNA is generally detectable in upper respiratory specimens during the acute phase of infection. The lowest concentration of SARS-CoV-2 viral copies this assay can detect is 138 copies/mL. Cadince Hilscher negative result does not preclude SARS-Cov-2 infection and should not be used as the sole basis for treatment or other patient management decisions. Denzal Meir negative result may occur with  improper specimen collection/handling, submission of specimen other than nasopharyngeal swab, presence of viral mutation(s) within the areas targeted by this assay, and inadequate number of viral copies(<138 copies/mL). Avey Mcmanamon negative result must be combined  with clinical observations, patient history, and epidemiological information. The expected result is Negative.  Fact Sheet for Patients:  BloggerCourse.com  Fact Sheet for Healthcare Providers:  SeriousBroker.it  This test is no t yet approved or cleared by the [place] FDA and  has been authorized for detection and/or diagnosis of SARS-CoV-2 by FDA under an Emergency Use Authorization (EUA). This EUA will remain  in effect (meaning this test can be used) for the duration of the COVID-19 declaration under Section 564(b)(1) of the Act, 21 U.S.C.section 360bbb-3(b)(1), unless the authorization is terminated  or revoked sooner.       Influenza Brantlee Hinde by PCR NEGATIVE NEGATIVE Final   Influenza B by PCR NEGATIVE NEGATIVE Final    Comment:  (NOTE) The Xpert Xpress SARS-CoV-2/FLU/RSV plus assay is intended as an aid in the diagnosis of influenza from Nasopharyngeal swab specimens and should not be used as Umer Harig sole basis for treatment. Nasal washings and aspirates are unacceptable for Xpert Xpress SARS-CoV-2/FLU/RSV testing.  Fact Sheet for Patients: BloggerCourse.com  Fact Sheet for Healthcare Providers: SeriousBroker.it  This test is not yet approved or cleared by the [place] FDA and has been authorized for detection and/or diagnosis of SARS-CoV-2 by FDA under an Emergency Use Authorization (EUA). This EUA will remain in effect (meaning this test can be used) for the duration of the COVID-19 declaration under Section 564(b)(1) of the Act, 21 U.S.C. section 360bbb-3(b)(1), unless the authorization is terminated or revoked.  Performed at [facility], [address], [place], [place] [address]          Radiology Studies: CT Angio Chest Pulmonary Embolism (PE) W or WO Contrast  Result Date: 11/03/2020 CLINICAL DATA:  PE suspected EXAM: CT ANGIOGRAPHY CHEST WITH CONTRAST TECHNIQUE: Multidetector CT imaging of the chest was performed using the standard protocol during bolus administration of intravenous contrast. Multiplanar CT image reconstructions and MIPs were obtained to evaluate the vascular anatomy. CONTRAST:  OMNIPAQUE IOHEXOL 350 MG/ML SOLN COMPARISON:  06/11/2019 FINDINGS: Cardiovascular: Satisfactory opacification of the pulmonary arteries to the segmental level. No evidence of pulmonary embolism. Cardiomegaly. No pericardial effusion. Mediastinum/Nodes: No enlarged mediastinal, hilar, or axillary lymph nodes. Thyroid gland, trachea, and esophagus demonstrate no significant findings. Lungs/Pleura: Mild bibasilar scarring and or atelectasis (series 6, image 62). No pleural effusion or pneumothorax. Upper Abdomen: No acute abnormality.  Musculoskeletal: No chest wall abnormality. No acute or significant osseous findings. Review of the MIP images confirms the above findings. IMPRESSION: 1. Negative examination for pulmonary embolism. 2. Mild bibasilar scarring and or atelectasis. 3. Cardiomegaly. Electronically Signed   By: Jearld Lesch M.D.   On: 11/03/2020 15:00   US Venous Img Lower Bilateral (DVT)  Result Date: 11/03/2020 CLINICAL DATA:  Bilateral lower extremity edema. EXAM: BILATERAL LOWER EXTREMITY VENOUS DOPPLER ULTRASOUND TECHNIQUE: Gray-scale sonography with graded compression, as well as color Doppler and duplex ultrasound were performed to evaluate the lower extremity deep venous systems from the level of the common femoral vein and including the common femoral, femoral, profunda femoral, popliteal and calf veins including the posterior tibial, peroneal and gastrocnemius veins when visible. The superficial great saphenous vein was also interrogated. Spectral Doppler was utilized to evaluate flow at rest and with distal augmentation maneuvers in the common femoral, femoral and popliteal veins. COMPARISON:  None. FINDINGS: RIGHT LOWER EXTREMITY Common Femoral Vein: No evidence of thrombus. Normal compressibility, respiratory phasicity and response to augmentation. Saphenofemoral Junction: No evidence of thrombus. Normal compressibility and flow on color Doppler imaging.  Profunda Femoral Vein: No evidence of thrombus. Normal compressibility and flow on color Doppler imaging. Femoral Vein: No evidence of thrombus. Normal compressibility, respiratory phasicity and response to augmentation. Popliteal Vein: No evidence of thrombus. Normal compressibility, respiratory phasicity and response to augmentation. Calf Veins: No evidence of thrombus. Normal compressibility and flow on color Doppler imaging. Superficial Great Saphenous Vein: No evidence of thrombus. Normal compressibility. Venous Reflux:  None. Other Findings: No evidence of  superficial thrombophlebitis or abnormal fluid collection. LEFT LOWER EXTREMITY Common Femoral Vein: No evidence of thrombus. Normal compressibility, respiratory phasicity and response to augmentation. Saphenofemoral Junction: No evidence of thrombus. Normal compressibility and flow on color Doppler imaging. Profunda Femoral Vein: No evidence of thrombus. Normal compressibility and flow on color Doppler imaging. Femoral Vein: No evidence of thrombus. Normal compressibility, respiratory phasicity and response to augmentation. Popliteal Vein: No evidence of thrombus. Normal compressibility, respiratory phasicity and response to augmentation. Calf Veins: No evidence of thrombus. Normal compressibility and flow on color Doppler imaging. Superficial Great Saphenous Vein: No evidence of thrombus. Normal compressibility. Venous Reflux:  None. Other Findings: No evidence of superficial thrombophlebitis or abnormal fluid collection. IMPRESSION: No evidence of deep venous thrombosis in either lower extremity. Electronically Signed   By: Irish Lack M.D.   On: 11/03/2020 10:11   ECHOCARDIOGRAM COMPLETE  Result Date: 11/02/2020    ECHOCARDIOGRAM REPORT   Patient Name:   Carmen Gomez Date of Exam: 11/02/2020 Medical Rec #:  [id number]          Height:       69.0 in Accession #:    [id number]         Weight:       300.0 lb Date of Birth:  [birth year]          BSA:          2.455 m Patient Age:    40 years           BP:           147/106 mmHg Patient Gender: F                  HR:           86 bpm. Exam Location:  [facility] Procedure: 2D Echo, Cardiac Doppler and Color Doppler Indications:     Dyspnea R06.00  History:         Patient has no prior history of Echocardiogram examinations.                  Risk Factors:Hypertension.  Sonographer:     Cristela Blue Referring Phys:  [id number] ERIC J [place] Diagnosing Phys: Cristal Deer End MD  Sonographer Comments: Technically challenging study due to limited acoustic windows, no  apical window and no subcostal window. IMPRESSIONS  1. Left ventricular ejection fraction, by estimation, is 45 to 50%. The left ventricle has mildly decreased function. Left ventricular endocardial border not optimally defined to evaluate regional wall motion. The left ventricular internal cavity size was mildly dilated. There is mild left ventricular hypertrophy. Left ventricular diastolic function could not be evaluated.  2. Right ventricular systolic function is mildly reduced. The right ventricular size is normal. Tricuspid regurgitation signal is inadequate for assessing PA pressure.  3. The mitral valve is abnormal. Trivial mitral valve regurgitation.  4. The aortic valve is tricuspid. Aortic valve regurgitation not well assessed. FINDINGS  Left Ventricle: Left ventricular ejection fraction, by estimation, is 45 to 50%. The left ventricle has mildly  decreased function. Left ventricular endocardial border not optimally defined to evaluate regional wall motion. The left ventricular internal cavity size was mildly dilated. There is mild left ventricular hypertrophy. Left ventricular diastolic function could not be evaluated. Right Ventricle: The right ventricular size is normal. No increase in right ventricular wall thickness. Right ventricular systolic function is mildly reduced. Tricuspid regurgitation signal is inadequate for assessing PA pressure. Left Atrium: Left atrial size was not well visualized. Right Atrium: Right atrial size was not well visualized. Pericardium: The pericardium was not well visualized. Mitral Valve: The mitral valve is abnormal. Trivial mitral valve regurgitation. Tricuspid Valve: The tricuspid valve is not well visualized. Tricuspid valve regurgitation is trivial. Aortic Valve: The aortic valve is tricuspid. Aortic valve regurgitation not well assessed. Pulmonic Valve: The pulmonic valve was not well visualized. Pulmonic valve regurgitation is not visualized. No evidence of pulmonic  stenosis. Aorta: The aortic root is normal in size and structure. Pulmonary Artery: The pulmonary artery is not well seen. Venous: The inferior vena cava was not well visualized. IAS/Shunts: The interatrial septum was not well visualized.  LEFT VENTRICLE PLAX 2D LVIDd:         5.27 cm LVIDs:         3.83 cm LV PW:         1.28 cm LV IVS:        1.32 cm LVOT diam:     2.00 cm LVOT Area:     3.14 cm  LEFT ATRIUM         Index LA diam:    3.40 cm 1.39 cm/m                        PULMONIC VALVE AORTA                 PV Vmax:        0.84 m/s Ao Root diam: 3.07 cm PV Vmean:       51.300 cm/s                       PV VTI:         0.132 m                       PV Peak grad:   2.8 mmHg                       PV Mean grad:   1.5 mmHg                       RVOT Peak grad: 3 mmHg   SHUNTS Systemic Diam: 2.00 cm Pulmonic VTI:  0.132 m Yvonne Kendall MD Electronically signed by Yvonne Kendall MD Signature Date/Time: 11/02/2020/4:11:44 PM    Final         Scheduled Meds:  enoxaparin (LOVENOX) injection  0.5 mg/kg Subcutaneous Q24H   furosemide  40 mg Intravenous Q12H   losartan  50 mg Oral Daily   metoprolol succinate  25 mg Oral Daily   nicotine  14 mg Transdermal Daily   Continuous Infusions:   LOS: 1 day    Time spent: over 30 min    Lacretia Nicks, MD [facility]   To contact the attending provider between 7A-7P or the covering provider during after hours 7P-7A, please log into the web site [email or web address] and access using universal [place] password for  that web site. If you do not have the password, please call the hospital operator.  11/03/2020, 5:41 PM

## 2020-11-03 NOTE — Consult Note (Signed)
   Heart Failure Nurse Navigator Note  HfmrEF 45 to 50%.  Mild LVH.  Right ventricular systolic function is mildly decreased.  She presented to the emergency room after being seen at a family care center for complaints of chest tightness and shortness of breath.  O2 saturations were noted to be in the 80s.  She had been off of her antihypertensive medications for a year.  Comorbidities:  Hypertension Morbid obesity Continued tobacco abuse  Medications:  Furosemide 40 mg IV every 12 hours Losartan 50 mg daily  Labs:  Sodium 141, potassium 3.5, chloride 97, CO2 38, BUN 11, creatinine 0.77, BNP on admission was 330, total cholesterol 150, triglycerides 251, HDL 33, LDL 67.  Initial meeting with patient today.  She states that she lives with her son whom today it is his birthday and he is 39 years old.  She states that she does not have a scale at home to weigh her self.  Offered to supply her with 1.  She states that she had not gotten her medications due to the pharmacy not delivering and also not having the money to pay for that at times.  She states that she does not work and does not get disability.  For most of the interview she did not make eye contact and answered with yes and no answers.  I left that heart failure teaching materials with her and told her that I would return tomorrow and we will we could continue with the instruction.  Tresa Endo RN [facility]

## 2020-11-03 NOTE — TOC Initial Note (Signed)
Transition of Care [facility]) - Initial/Assessment Note    Patient Details  Name: Carmen Gomez MRN: [id number] Date of Birth: 21-Nov-1980  [facility] CM/SW Contact:    Allayne Butcher, RN Phone Number: 11/03/2020, 2:53 PM  Clinical Narrative:                 [facility] consult acknowledged.  Jimsey, Heart Failure Nurse Navigator, has already spoken with patient at the bedside today.   TOC signed off, please re-consult if any additional needs arise.         Patient Goals and CMS Choice        Expected Discharge Plan and Services                                                Prior Living Arrangements/Services                       Activities of Daily Living Home Assistive Devices/Equipment: None ADL Screening (condition at time of admission) Patient's cognitive ability adequate to safely complete daily activities?: Yes Is the patient deaf or have difficulty hearing?: No Does the patient have difficulty seeing, even when wearing glasses/contacts?: No Does the patient have difficulty concentrating, remembering, or making decisions?: No Patient able to express need for assistance with ADLs?: Yes Does the patient have difficulty dressing or bathing?: No Independently performs ADLs?: Yes (appropriate for developmental age) Does the patient have difficulty walking or climbing stairs?: No Weakness of Legs: None Weakness of Arms/Hands: None  Permission Sought/Granted                  Emotional Assessment              Admission diagnosis:  Hypoxia [R09.02] Hypertensive emergency [I16.1] Hypertension, unspecified type [I10] Acute congestive heart failure, unspecified heart failure type [facility]) [I50.9] Patient Active Problem List   Diagnosis Date Noted   Hypertensive emergency 11/02/2020   Acute hypoxemic respiratory failure (HCC) 11/02/2020   Acute CHF (congestive heart failure) (HCC) 11/02/2020   Prolonged QT interval 11/02/2020    Acute asthma exacerbation 06/11/2019   Depression 05/11/2019   Cholelithiasis    Asthma 08/29/2016   Morbid obesity with BMI of 45.0-49.9, adult (HCC) 08/29/2016   Diplopia 06/02/2013   PCP:  [facility] Pharmacy:   [facility] [address] [place], [place] - [address] [place] [address] Phone: [phone] Fax: [phone]  [id number] - [place], [place] - [address] [place] [address] Phone: [phone] Fax: [phone]     Social Determinants of Health (SDOH) Interventions    Readmission Risk Interventions No flowsheet data found.

## 2020-11-03 NOTE — Consult Note (Signed)
[facility] Cardiology  CARDIOLOGY CONSULT NOTE  Patient ID: Carmen Carmen MRN: [id number] DOB/AGE: 1980-06-11 40 y.o.  Admit date: 11/02/2020 Referring Physician [facility], Carmen Carmen [facility] Primary Cardiologist Carmen Gomez Reason for Consultation HTN urgency, mild HFrEF, chest pain  HPI:  Carmen Carmen is a 40 year old female with history of hypertension, morbid obesity, tobacco use who was admitted on 11 1 with complaints of shortness of breath and chest pain.  Cardiology is consulted for a mildly reduced ejection fraction and chest discomfort.  She tried to be seen by her PCP but she was sent to the ED because of hypoxia. She says for the previous 2 weeks she had a cough and difficulty breathing. When she would climb her stairs it was difficult to breath; she had ran out of her inhaler about a week ago and after this it seemed to worsen. She says during this time she had elevated blood blood pressure. She also had chills, and cough productive of sputum. HA was constant in the back of her head. She has has constant pain in her chest as well, ongoing for months now. She has LE edema which is ongoing for a few weeks. Chronic orthopnea.   To the emergency department the patient was saturating in the 80s which improved with supplemental oxygen to 4 L.  Heart rate was in the 80s, and blood pressure was elevated to the 150s /100s.  She has been given IV Lasix and started on losartan 50 mg daily.  Her blood pressure has not significantly improved.  High-sensitivity troponin was mildly elevated with a peak of 28.  EKG shows sinus rhythm with a right bundle branch block.  Social - Current smoker- 1/2 ppd - Drinks alcohol- drinks more than 12 beers a few days a week.  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   Cholelithiasis    Depression    NO MEDS   Diplopia    GERD (gastroesophageal reflux disease)    NO MEDS   Hypertension    Morbid obesity with BMI of 45.0-49.9, adult [facility])     Past Surgical History:  Procedure Laterality Date   CHOLECYSTECTOMY N/A 10/25/2016   Procedure: LAPAROSCOPIC CHOLECYSTECTOMY;  Surgeon: Leafy Ro, MD;  Location: [facility] ORS;  Service: General;  Laterality: N/A;   EYE SURGERY Left    DETACHED RETINA   HERNIA REPAIR     UMBILICAL HERNIA REPAIR N/A 10/25/2016   Procedure: HERNIA REPAIR UMBILICAL ADULT;  Surgeon: Leafy Ro, MD;  Location: [facility] ORS;  Service: General;  Laterality: N/A;    Medications Prior to Admission  Medication Sig Dispense Refill Last Dose   albuterol (VENTOLIN HFA) 108 (90 Base) MCG/ACT inhaler Inhale 2 puffs into the lungs every 4 (four) hours as needed for wheezing or shortness of breath. (Patient not taking: Reported on 11/02/2020) 6.7 g 1 Not Taking   Social History   Socioeconomic History   Marital status: Single    Spouse name: Not on file   Number of children: Not on file   Years of education: Not on file   Highest education level: Not on file  Occupational History   Not on file  Tobacco Use   Smoking status: Every Day  Packs/day: 0.50    Years: 18.00    Pack years: 9.00    Types: Cigarettes   Smokeless tobacco: Never  Vaping Use   Vaping Use: Never used  Substance and Sexual Activity   Alcohol use: Yes    Comment: Approximately 12-18 Beers weekly   Drug use: Yes    Types: Marijuana   Sexual activity: Not on file  Other Topics Concern   Not on file  Social History Narrative   Not on file   Social Determinants of Health   Financial Resource Strain: Not on file  Food Insecurity: Not on file  Transportation Needs: Not on file  Physical Activity: Not on file  Stress: Not on file  Social Connections: Not on file  Intimate Partner Violence: Not on file    Family  History  Problem Relation Age of Onset   Stroke Sister 30   Diabetes Maternal Grandmother       Review of systems complete and found to be negative unless listed above      PHYSICAL EXAM  Vitals:   11/03/20 0607 11/03/20 0838  BP: (!) 154/107 (!) 142/98  Pulse: 98 99  Resp: 20 18  Temp: (!) 97.5 F (36.4 C) 98.6 F (37 C)  SpO2: 93% 94%     General: Morbidly obese. Well developed, well nourished, in no acute distress HEENT:  Normocephalic and atramatic Neck:  No JVD; exam limited by body habitus.  Lungs: Clear bilaterally to auscultation and percussion. Heart: HRRR . Normal S1 and S2 without gallops or murmurs.  Abdomen: Bowel sounds are positive, abdomen soft and non-tender  Msk:  Back normal, normal gait. Normal strength and tone for age. Extremities: 1+ BL LE edema.    Neuro: Alert and oriented X 3. Psych:  Good affect, responds appropriately  Labs:   Lab Results  Component Value Date   WBC 6.2 11/02/2020   HGB 13.0 11/02/2020   HCT 43.5 11/02/2020   MCV 103.3 (H) 11/02/2020   PLT 316 11/02/2020    Recent Labs  Lab 11/03/20 0501  Carmen Gomez 141  K 3.5  CL 97*  CO2 38*  BUN 11  CREATININE 0.77  CALCIUM 8.8*  GLUCOSE 104*   Lab Results  Component Value Date   TROPONINI <0.03 01/24/2017    Lab Results  Component Value Date   CHOL 150 11/02/2020   Lab Results  Component Value Date   HDL 33 (L) 11/02/2020   Lab Results  Component Value Date   LDLCALC 67 11/02/2020   Lab Results  Component Value Date   TRIG 251 (H) 11/02/2020   Lab Results  Component Value Date   CHOLHDL 4.5 11/02/2020   No results found for: LDLDIRECT    Radiology: DG Chest 2 View  Result Date: 11/01/2020 CLINICAL DATA:  chest pain EXAM: CHEST - 2 VIEW COMPARISON:  07/11/2019. FINDINGS: Moderate enlargement the cardiac silhouette, which appears mildly increased from the prior. Pulmonary vascular congestion. No consolidation. No visible pleural effusions or pneumothorax.  IMPRESSION: 1. Moderate enlargement of the cardiac silhouette, which appears mildly increased from the prior. Echocardiogram could better evaluate for underlying pericardial effusion if clinically indicated. 2. Pulmonary vascular congestion without overt pulmonary edema. Electronically Signed   By: Feliberto Harts M.D.   On: 11/01/2020 13:34   ECHOCARDIOGRAM COMPLETE  Result Date: 11/02/2020    ECHOCARDIOGRAM REPORT   Patient Name:   SCOTTIE STANISH Date of Exam: 11/02/2020 Medical Rec #:  [id number]  Height:       69.0 in Accession #:    [id number]         Weight:       300.0 lb Date of Birth:  1980/09/20          BSA:          2.455 m Patient Age:    40 years           BP:           147/106 mmHg Patient Gender: F                  HR:           86 bpm. Exam Location:  [facility] Procedure: 2D Echo, Cardiac Doppler and Color Doppler Indications:     Dyspnea R06.00  History:         Patient has no prior history of Echocardiogram examinations.                  Risk Factors:Hypertension.  Sonographer:     Cristela Blue Referring Phys:  [id number] ERIC J [place] Diagnosing Phys: Cristal Deer End MD  Sonographer Comments: Technically challenging study due to limited acoustic windows, no apical window and no subcostal window. IMPRESSIONS  1. Left ventricular ejection fraction, by estimation, is 45 to 50%. The left ventricle has mildly decreased function. Left ventricular endocardial border not optimally defined to evaluate regional wall motion. The left ventricular internal cavity size was mildly dilated. There is mild left ventricular hypertrophy. Left ventricular diastolic function could not be evaluated.  2. Right ventricular systolic function is mildly reduced. The right ventricular size is normal. Tricuspid regurgitation signal is inadequate for assessing PA pressure.  3. The mitral valve is abnormal. Trivial mitral valve regurgitation.  4. The aortic valve is tricuspid. Aortic valve regurgitation not well  assessed. FINDINGS  Left Ventricle: Left ventricular ejection fraction, by estimation, is 45 to 50%. The left ventricle has mildly decreased function. Left ventricular endocardial border not optimally defined to evaluate regional wall motion. The left ventricular internal cavity size was mildly dilated. There is mild left ventricular hypertrophy. Left ventricular diastolic function could not be evaluated. Right Ventricle: The right ventricular size is normal. No increase in right ventricular wall thickness. Right ventricular systolic function is mildly reduced. Tricuspid regurgitation signal is inadequate for assessing PA pressure. Left Atrium: Left atrial size was not well visualized. Right Atrium: Right atrial size was not well visualized. Pericardium: The pericardium was not well visualized. Mitral Valve: The mitral valve is abnormal. Trivial mitral valve regurgitation. Tricuspid Valve: The tricuspid valve is not well visualized. Tricuspid valve regurgitation is trivial. Aortic Valve: The aortic valve is tricuspid. Aortic valve regurgitation not well assessed. Pulmonic Valve: The pulmonic valve was not well visualized. Pulmonic valve regurgitation is not visualized. No evidence of pulmonic stenosis. Aorta: The aortic root is normal in size and structure. Pulmonary Artery: The pulmonary artery is not well seen. Venous: The inferior vena cava was not well visualized. IAS/Shunts: The interatrial septum was not well visualized.  LEFT VENTRICLE PLAX 2D LVIDd:         5.27 cm LVIDs:         3.83 cm LV PW:         1.28 cm LV IVS:        1.32 cm LVOT diam:     2.00 cm LVOT Area:     3.14 cm  LEFT ATRIUM  Index LA diam:    3.40 cm 1.39 cm/m                        PULMONIC VALVE AORTA                 PV Vmax:        0.84 m/s Ao Root diam: 3.07 cm PV Vmean:       51.300 cm/s                       PV VTI:         0.132 m                       PV Peak grad:   2.8 mmHg                       PV Mean grad:   1.5 mmHg                        RVOT Peak grad: 3 mmHg   SHUNTS Systemic Diam: 2.00 cm Pulmonic VTI:  0.132 m Yvonne Kendall MD Electronically signed by Yvonne Kendall MD Signature Date/Time: 11/02/2020/4:11:44 PM    Final     EKG: Sinus rhythm with right bundle branch block  ASSESSMENT AND PLAN:  Anginette Espejo is a 40 year old female with history of hypertension, morbid obesity, tobacco use who was admitted on 11 1 with complaints of shortness of breath and chest pain.  Cardiology is consulted for a mildly reduced ejection fraction and chest discomfort.  #Mild HFrEF (EF approximately 50%) #Hypertensive urgency #Morbid obesity #Chest pain The patient is admitted with hypertensive urgency and in the setting has chest discomfort and shortness of breath.  She has improved some with diuresis.  An echocardiogram completed on admission shows an EF of about 45 to 50%, though the echo images are limited because of her body habitus.  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 Her chest pain is atypical in that it is present all of the time for more than a month now. No inpatient ischemic evaluation is currently needed.  -Agree with initiation of losartan - Start metoprolol XL 25 mg daily  - Switch to PO lasix tomorrow.  - Needs to be compliant with medications and abstain from ETOH.  - Will need follow up as outpatient with me after 1 week when ready for DC.   Signed: Armando Reichert MD 11/03/2020, 9:36 AM

## 2020-11-04 ENCOUNTER — Other Ambulatory Visit: Payer: Self-pay

## 2020-11-04 ENCOUNTER — Inpatient Hospital Stay: Payer: Medicaid Other

## 2020-11-04 DIAGNOSIS — I10 Essential (primary) hypertension: Secondary | ICD-10-CM | POA: Diagnosis not present

## 2020-11-04 LAB — COMPREHENSIVE METABOLIC PANEL
ALT: 15 U/L (ref 0–44)
AST: 16 U/L (ref 15–41)
Albumin: 3.7 g/dL (ref 3.5–5.0)
Alkaline Phosphatase: 63 U/L (ref 38–126)
Anion gap: 7 (ref 5–15)
BUN: 11 mg/dL (ref 6–20)
CO2: 34 mmol/L — ABNORMAL HIGH (ref 22–32)
Calcium: 9.2 mg/dL (ref 8.9–10.3)
Chloride: 95 mmol/L — ABNORMAL LOW (ref 98–111)
Creatinine, Ser: 0.63 mg/dL (ref 0.44–1.00)
GFR, Estimated: >60 mL/min (ref >60)
Glucose, Bld: 98 mg/dL (ref 70–99)
Potassium: 3.9 mmol/L (ref 3.5–5.1)
Sodium: 136 mmol/L (ref 135–145)
Total Bilirubin: 1.1 mg/dL (ref 0.3–1.2)
Total Protein: 7.2 g/dL (ref 6.5–8.1)

## 2020-11-04 LAB — CBC WITH DIFFERENTIAL/PLATELET
Abs Immature Granulocytes: 0.02 10*3/uL (ref 0.00–0.07)
Basophils Absolute: 0 10*3/uL (ref 0.0–0.1)
Basophils Relative: 0 %
Eosinophils Absolute: 0.1 10*3/uL (ref 0.0–0.5)
Eosinophils Relative: 1 %
HCT: 41.5 % (ref 36.0–46.0)
Hemoglobin: 12.6 g/dL (ref 12.0–15.0)
Immature Granulocytes: 0 %
Lymphocytes Relative: 15 %
Lymphs Abs: 1.1 10*3/uL (ref 0.7–4.0)
MCH: 30.7 pg (ref 26.0–34.0)
MCHC: 30.4 g/dL (ref 30.0–36.0)
MCV: 101 fL — ABNORMAL HIGH (ref 80.0–100.0)
Monocytes Absolute: 0.7 10*3/uL (ref 0.1–1.0)
Monocytes Relative: 9 %
Neutro Abs: 5.7 10*3/uL (ref 1.7–7.7)
Neutrophils Relative %: 75 %
Platelets: 309 10*3/uL (ref 150–400)
RBC: 4.11 MIL/uL (ref 3.87–5.11)
RDW: 15.7 % — ABNORMAL HIGH (ref 11.5–15.5)
WBC: 7.7 10*3/uL (ref 4.0–10.5)
nRBC: 0 % (ref 0.0–0.2)

## 2020-11-04 LAB — PHOSPHORUS: Phosphorus: 4.4 mg/dL (ref 2.5–4.6)

## 2020-11-04 LAB — MAGNESIUM: Magnesium: 1.9 mg/dL (ref 1.7–2.4)

## 2020-11-04 MED ORDER — LOSARTAN POTASSIUM 50 MG PO TABS: 50.0000 mg | ORAL_TABLET | Freq: Every day | ORAL | 0 refills | Status: AC

## 2020-11-04 MED ORDER — FUROSEMIDE 40 MG PO TABS: 40.0000 mg | ORAL_TABLET | Freq: Every day | ORAL | 0 refills | Status: AC

## 2020-11-04 MED ORDER — METOPROLOL SUCCINATE ER 25 MG PO TB24: 25.0000 mg | ORAL_TABLET | Freq: Every day | ORAL | 0 refills | Status: AC

## 2020-11-04 NOTE — Consult Note (Signed)
[facility] Cardiology  CARDIOLOGY CONSULT NOTE  Patient ID: Carmen Gomez MRN: [id number] DOB/AGE: 1980/06/04 40 y.o.  Admit date: 11/02/2020 Referring Physician [facility], [facility] Primary Cardiologist NA Reason for Consultation HTN urgency, mild HFrEF, chest pain  HPI:  Carmen Gomez is a 40 year old female with history of hypertension, morbid obesity, tobacco use who was admitted on 11 1 with complaints of shortness of breath and chest pain.  Cardiology is consulted for a mildly reduced ejection fraction and chest discomfort.  Interval history - BP is slightly improved.  - I&O not well measured but renal function is stable. 8 unmeasured urines.  - Feels her breathing is back to her baseline.   Social - Current smoker- 1/2 ppd - Drinks alcohol- drinks more than 12 beers a few days a week.  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   Cholelithiasis    Depression    NO MEDS   Diplopia    GERD (gastroesophageal reflux disease)    NO MEDS   Hypertension    Morbid obesity with BMI of 45.0-49.9, adult [facility])     Past Surgical History:  Procedure Laterality Date   CHOLECYSTECTOMY N/A 10/25/2016   Procedure: LAPAROSCOPIC CHOLECYSTECTOMY;  Surgeon: Leafy Ro, MD;  Location: [facility] ORS;  Service: General;  Laterality: N/A;   EYE SURGERY Left    DETACHED RETINA   HERNIA REPAIR     UMBILICAL HERNIA REPAIR N/A 10/25/2016   Procedure: HERNIA REPAIR UMBILICAL ADULT;  Surgeon: Leafy Ro, MD;  Location: [facility] ORS;  Service: General;  Laterality: N/A;    Medications Prior to Admission  Medication Sig Dispense Refill Last Dose   albuterol (VENTOLIN HFA) 108 (90 Base) MCG/ACT inhaler Inhale 2 puffs into the lungs every 4 (four) hours as  needed for wheezing or shortness of breath. (Patient not taking: Reported on 11/02/2020) 6.7 g 1 Not Taking   Social History   Socioeconomic History   Marital status: Single    Spouse name: Not on file   Number of children: Not on file   Years of education: Not on file   Highest education level: Not on file  Occupational History   Not on file  Tobacco Use   Smoking status: Every Day    Packs/day: 0.50    Years: 18.00    Pack years: 9.00    Types: Cigarettes   Smokeless tobacco: Never  Vaping Use   Vaping Use: Never used  Substance and Sexual Activity   Alcohol use: Yes    Comment: Approximately 12-18 Beers weekly   Drug use: Yes    Types: Marijuana   Sexual activity: Not on file  Other Topics Concern   Not on file  Social History Narrative   Not on file   Social Determinants of Health   Financial Resource Strain: Not on file  Food Insecurity: Not on file  Transportation Needs: Not on file  Physical Activity: Not on file  Stress: Not on file  Social Connections: Not on file  Intimate Partner Violence: Not on file    Family History  Problem Relation Age of Onset   Stroke Sister 30   Diabetes Maternal Grandmother       Review of  systems complete and found to be negative unless listed above      PHYSICAL EXAM  Vitals:   11/04/20 0011 11/04/20 0547  BP: (!) 150/102 (!) 132/92  Pulse: 97 (!) 102  Resp: 17 18  Temp: 98 F (36.7 C) 98.2 F (36.8 C)  SpO2: 91% 93%     General: Morbidly obese. Well developed, well nourished, in no acute distress HEENT:  Normocephalic and atramatic Neck:  No JVD; exam limited by body habitus.  Lungs: Clear bilaterally to auscultation and percussion. Heart: HRRR . Normal S1 and S2 without gallops or murmurs.  Abdomen: Bowel sounds are positive, abdomen soft and non-tender  Msk:  Back normal, normal gait. Normal strength and tone for age. Extremities: 1+ BL LE edema.    Neuro: Alert and oriented X 3. Psych:  Good affect,  responds appropriately  Labs:   Lab Results  Component Value Date   WBC 7.7 11/04/2020   HGB 12.6 11/04/2020   HCT 41.5 11/04/2020   MCV 101.0 (H) 11/04/2020   PLT 309 11/04/2020    Recent Labs  Lab 11/04/20 0516  NA 136  K 3.9  CL 95*  CO2 34*  BUN 11  CREATININE 0.63  CALCIUM 9.2  PROT 7.2  BILITOT 1.1  ALKPHOS 63  ALT 15  AST 16  GLUCOSE 98    Lab Results  Component Value Date   TROPONINI <0.03 01/24/2017     Lab Results  Component Value Date   CHOL 150 11/02/2020   Lab Results  Component Value Date   HDL 33 (L) 11/02/2020   Lab Results  Component Value Date   LDLCALC 67 11/02/2020   Lab Results  Component Value Date   TRIG 251 (H) 11/02/2020   Lab Results  Component Value Date   CHOLHDL 4.5 11/02/2020   No results found for: LDLDIRECT    Radiology: DG Chest 2 View  Result Date: 11/01/2020 CLINICAL DATA:  chest pain EXAM: CHEST - 2 VIEW COMPARISON:  07/11/2019. FINDINGS: Moderate enlargement the cardiac silhouette, which appears mildly increased from the prior. Pulmonary vascular congestion. No consolidation. No visible pleural effusions or pneumothorax. IMPRESSION: 1. Moderate enlargement of the cardiac silhouette, which appears mildly increased from the prior. Echocardiogram could better evaluate for underlying pericardial effusion if clinically indicated. 2. Pulmonary vascular congestion without overt pulmonary edema. Electronically Signed   By: Feliberto Harts M.D.   On: 11/01/2020 13:34   CT Angio Chest Pulmonary Embolism (PE) W or WO Contrast  Result Date: 11/03/2020 CLINICAL DATA:  PE suspected EXAM: CT ANGIOGRAPHY CHEST WITH CONTRAST TECHNIQUE: Multidetector CT imaging of the chest was performed using the standard protocol during bolus administration of intravenous contrast. Multiplanar CT image reconstructions and MIPs were obtained to evaluate the vascular anatomy. CONTRAST:  OMNIPAQUE IOHEXOL 350 MG/ML SOLN COMPARISON:  06/11/2019  FINDINGS: Cardiovascular: Satisfactory opacification of the pulmonary arteries to the segmental level. No evidence of pulmonary embolism. Cardiomegaly. No pericardial effusion. Mediastinum/Nodes: No enlarged mediastinal, hilar, or axillary lymph nodes. Thyroid gland, trachea, and esophagus demonstrate no significant findings. Lungs/Pleura: Mild bibasilar scarring and or atelectasis (series 6, image 62). No pleural effusion or pneumothorax. Upper Abdomen: No acute abnormality. Musculoskeletal: No chest wall abnormality. No acute or significant osseous findings. Review of the MIP images confirms the above findings. IMPRESSION: 1. Negative examination for pulmonary embolism. 2. Mild bibasilar scarring and or atelectasis. 3. Cardiomegaly. Electronically Signed   By: Jearld Lesch M.D.   On: 11/03/2020 15:00  US Venous Img Lower Bilateral (DVT)  Result Date: 11/03/2020 CLINICAL DATA:  Bilateral lower extremity edema. EXAM: BILATERAL LOWER EXTREMITY VENOUS DOPPLER ULTRASOUND TECHNIQUE: Gray-scale sonography with graded compression, as well as color Doppler and duplex ultrasound were performed to evaluate the lower extremity deep venous systems from the level of the common femoral vein and including the common femoral, femoral, profunda femoral, popliteal and calf veins including the posterior tibial, peroneal and gastrocnemius veins when visible. The superficial great saphenous vein was also interrogated. Spectral Doppler was utilized to evaluate flow at rest and with distal augmentation maneuvers in the common femoral, femoral and popliteal veins. COMPARISON:  None. FINDINGS: RIGHT LOWER EXTREMITY Common Femoral Vein: No evidence of thrombus. Normal compressibility, respiratory phasicity and response to augmentation. Saphenofemoral Junction: No evidence of thrombus. Normal compressibility and flow on color Doppler imaging. Profunda Femoral Vein: No evidence of thrombus. Normal compressibility and flow on color  Doppler imaging. Femoral Vein: No evidence of thrombus. Normal compressibility, respiratory phasicity and response to augmentation. Popliteal Vein: No evidence of thrombus. Normal compressibility, respiratory phasicity and response to augmentation. Calf Veins: No evidence of thrombus. Normal compressibility and flow on color Doppler imaging. Superficial Great Saphenous Vein: No evidence of thrombus. Normal compressibility. Venous Reflux:  None. Other Findings: No evidence of superficial thrombophlebitis or abnormal fluid collection. LEFT LOWER EXTREMITY Common Femoral Vein: No evidence of thrombus. Normal compressibility, respiratory phasicity and response to augmentation. Saphenofemoral Junction: No evidence of thrombus. Normal compressibility and flow on color Doppler imaging. Profunda Femoral Vein: No evidence of thrombus. Normal compressibility and flow on color Doppler imaging. Femoral Vein: No evidence of thrombus. Normal compressibility, respiratory phasicity and response to augmentation. Popliteal Vein: No evidence of thrombus. Normal compressibility, respiratory phasicity and response to augmentation. Calf Veins: No evidence of thrombus. Normal compressibility and flow on color Doppler imaging. Superficial Great Saphenous Vein: No evidence of thrombus. Normal compressibility. Venous Reflux:  None. Other Findings: No evidence of superficial thrombophlebitis or abnormal fluid collection. IMPRESSION: No evidence of deep venous thrombosis in either lower extremity. Electronically Signed   By: Irish Lack M.D.   On: 11/03/2020 10:11   ECHOCARDIOGRAM COMPLETE  Result Date: 11/02/2020    ECHOCARDIOGRAM REPORT   Patient Name:   Carmen Gomez Date of Exam: 11/02/2020 Medical Rec #:  [id number]          Height:       69.0 in Accession #:    [id number]         Weight:       300.0 lb Date of Birth:  [birth year]          BSA:          2.455 m Patient Age:    40 years           BP:           147/106 mmHg  Patient Gender: F                  HR:           86 bpm. Exam Location:  [facility] Procedure: 2D Echo, Cardiac Doppler and Color Doppler Indications:     Dyspnea R06.00  History:         Patient has no prior history of Echocardiogram examinations.                  Risk Factors:Hypertension.  Sonographer:     Cristela Blue Referring Phys:  [id number] ERIC J [place] Diagnosing  Phys: Yvonne Kendall MD  Sonographer Comments: Technically challenging study due to limited acoustic windows, no apical window and no subcostal window. IMPRESSIONS  1. Left ventricular ejection fraction, by estimation, is 45 to 50%. The left ventricle has mildly decreased function. Left ventricular endocardial border not optimally defined to evaluate regional wall motion. The left ventricular internal cavity size was mildly dilated. There is mild left ventricular hypertrophy. Left ventricular diastolic function could not be evaluated.  2. Right ventricular systolic function is mildly reduced. The right ventricular size is normal. Tricuspid regurgitation signal is inadequate for assessing PA pressure.  3. The mitral valve is abnormal. Trivial mitral valve regurgitation.  4. The aortic valve is tricuspid. Aortic valve regurgitation not well assessed. FINDINGS  Left Ventricle: Left ventricular ejection fraction, by estimation, is 45 to 50%. The left ventricle has mildly decreased function. Left ventricular endocardial border not optimally defined to evaluate regional wall motion. The left ventricular internal cavity size was mildly dilated. There is mild left ventricular hypertrophy. Left ventricular diastolic function could not be evaluated. Right Ventricle: The right ventricular size is normal. No increase in right ventricular wall thickness. Right ventricular systolic function is mildly reduced. Tricuspid regurgitation signal is inadequate for assessing PA pressure. Left Atrium: Left atrial size was not well visualized. Right Atrium: Right atrial size was  not well visualized. Pericardium: The pericardium was not well visualized. Mitral Valve: The mitral valve is abnormal. Trivial mitral valve regurgitation. Tricuspid Valve: The tricuspid valve is not well visualized. Tricuspid valve regurgitation is trivial. Aortic Valve: The aortic valve is tricuspid. Aortic valve regurgitation not well assessed. Pulmonic Valve: The pulmonic valve was not well visualized. Pulmonic valve regurgitation is not visualized. No evidence of pulmonic stenosis. Aorta: The aortic root is normal in size and structure. Pulmonary Artery: The pulmonary artery is not well seen. Venous: The inferior vena cava was not well visualized. IAS/Shunts: The interatrial septum was not well visualized.  LEFT VENTRICLE PLAX 2D LVIDd:         5.27 cm LVIDs:         3.83 cm LV PW:         1.28 cm LV IVS:        1.32 cm LVOT diam:     2.00 cm LVOT Area:     3.14 cm  LEFT ATRIUM         Index LA diam:    3.40 cm 1.39 cm/m                        PULMONIC VALVE AORTA                 PV Vmax:        0.84 m/s Ao Root diam: 3.07 cm PV Vmean:       51.300 cm/s                       PV VTI:         0.132 m                       PV Peak grad:   2.8 mmHg                       PV Mean grad:   1.5 mmHg  RVOT Peak grad: 3 mmHg   SHUNTS Systemic Diam: 2.00 cm Pulmonic VTI:  0.132 m Yvonne Kendall MD Electronically signed by Yvonne Kendall MD Signature Date/Time: 11/02/2020/4:11:44 PM    Final     EKG: Sinus rhythm with right bundle branch block  ASSESSMENT AND PLAN:  Carmen Gomez is a 39 year old female with history of hypertension, morbid obesity, tobacco use who was admitted on 11 1 with complaints of shortness of breath and chest pain.  Cardiology is consulted for a mildly reduced ejection fraction and chest discomfort.  #Mild HFrEF (EF approximately 50%) #Hypertensive urgency #Morbid obesity #Chest pain The patient is admitted with hypertensive urgency and in the setting has chest  discomfort and shortness of breath.  She has improved some with diuresis.  An echocardiogram completed on admission shows an EF of about 45 to 50%, though the echo images are limited because of her body habitus.  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 Her chest pain is atypical in that it is present all of the time for more than a month now. No inpatient ischemic evaluation is currently needed.  - Continue losartan - continue metoprolol XL 25 mg daily  - Switch to PO lasix today--> follow I&O; may need afternoon dose.  - Needs to be compliant with medications and abstain from ETOH.  - Will need follow up as outpatient with me after 1 week  Signed: Armando Reichert MD 11/04/2020, 7:35 AM

## 2020-11-04 NOTE — Discharge Summary (Signed)
Physician Discharge Summary  Carmen Gomez BMW:[id number] DOB: 02-19-1980 DOA: 11/02/2020  PCP: [facility]  Admit date: 11/02/2020 Discharge date: 11/04/2020  Time spent: 40 minutes  Recommendations for Outpatient Follow-up:  Follow outpatient CBC/CMP Follow with cardiology outpatient for new heart failure, blood pressure Follow BP outpatient, adjust meds as able Concern for IIH - needs ophtho follow up and neuro follow up - likely needs LP outpatient Follow Qtc prolongation outpatient, avoid qt prolonging meds Encourage etoh and smoking cessation   Discharge Diagnoses:  Active Problems:   Asthma   Morbid obesity with BMI of 45.0-49.9, adult [facility])   Hypertensive emergency   Acute hypoxemic respiratory failure (HCC)   Acute CHF (congestive heart failure) (HCC)   Prolonged QT interval   Hypertension   Discharge Condition: stable  Diet recommendation: heart healthy  Filed Weights   11/01/20 1231 11/03/20 1626 11/04/20 0956  Weight: (!) 136.1 kg (!) 139.6 kg (!) 136.9 kg    History of present illness:   Carmen Gomez is Carmen Gomez 40 y.o. female with medical history significant of essential hypertension, morbid obesity, tobacco use disorder who presents to [facility] ED on 11/1 via EMS with complaints of shortness of breath, chest pain.  Patient reports onset roughly 1 week ago.  Patient has been noncompliant with home antihypertensives for roughly 1 year.  Continues with tobacco use.  Patient was being seen at the family care center complaining of chest tightness and shortness of breath and EMS was called.  On arrival, patient's oxygen saturation noted to be in the 80s and placed on 4 L nasal cannula.   ED Course: Temperature 98.2 F, HR 100, RR 18, BP 180/110, SPO2 97% on 4 L nasal cannula.  Sodium 142, potassium 4.1, chloride 103, CO2 33, BUN 9, creatinine 0.67.  Glucose 103.  WBC 5.3, hemoglobin 12.6, platelets 294.  BNP elevated 330.2.  High sensitive  troponin 28>25.  Chest x-ray with enlarged cardiac silhouette, pulmonary vascular congestion.  COVID-19 PCR negative.  Influenza Shrinika Blatz/B PCR negative.  EKG personally reviewed with normal sinus rhythm, rate 99, QTc 580, RBBB, no concerning ST elevation/depression or T wave inversions.  Patient was given 10 mg IV labetalol, inch Nitropaste, and IV Lasix by EDP.  Hospital service consulted for further evaluation and management of acute respite failure secondary to hypertensive emergency, CHF exacerbation.  She was admitted for hypertensive emergency and Maddi Collar heart failure exacerbation.  She's improved with BP meds and diuresis.  Found to have mildly decreased EF.  Head CT concerning for IIH.  She's not willing to stay for LP, plan outpatient follow up with neuro and ophtho.   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  Will refer to outpatient neurology.  Recommend she follow up  with ophthalmology outpatient.   Qtc Prolongatino Repeat in AM Caution with qtc prolonging meds Follow mag Follow outpatient   Etoh Abuse [place] Encourage cessation   Tobacco Use disorder Encoruage cessation   Obesity Body mass index is 44.31 kg/m.    Procedures: Echo IMPRESSIONS     1. Left ventricular ejection fraction, by estimation, is 45 to 50%. The  left ventricle has mildly decreased function. Left ventricular endocardial  border not optimally defined to evaluate regional wall motion. The left  ventricular internal cavity size  was mildly dilated. There is mild left ventricular hypertrophy. Left  ventricular diastolic function could not be evaluated.   2. Right ventricular systolic function is mildly reduced. The right  ventricular size is normal. Tricuspid regurgitation signal is inadequate  for assessing PA pressure.   3. The mitral valve is abnormal. Trivial mitral valve regurgitation.   4. The aortic valve is tricuspid. Aortic valve regurgitation not well  assessed.   LE US IMPRESSION: No evidence of deep venous thrombosis in either lower extremity. Consultations: cardiology  Discharge Exam: Vitals:   11/04/20 1136 11/04/20 1646  BP: (!) 143/94 131/77  Pulse: 97 97  Resp: 18 16  Temp: 98.6 F (37 C) 98.2 F (36.8 C)  SpO2: 97% 90%   Eager to discharge Agitated - doesn't want to stay for LP to r/o IIH - discussed recommendation and rationale - she's adament she won't stay  General: No acute distress. Cardiovascular: Heart sounds show Daren Yeagle regular rate, and rhythm.  Lungs: unlabored Abdomen: Soft, nontender, nondistended  Neurological: Alert and oriented 3. Moves all extremities 4 with equal strength. Cranial nerves II through XII grossly intact. Skin: Warm and dry. No rashes or lesions. Extremities: bilateral LE edema   Discharge Instructions   Discharge Instructions     Ambulatory referral to Neurology   Complete by: As directed    An  appointment is requested in approximately: 2 weeks   Call MD for:  difficulty breathing, headache or visual disturbances   Complete by: As directed    Call MD for:  extreme fatigue   Complete by: As directed    Call MD for:  hives   Complete by: As directed    Call MD for:  persistant dizziness or light-headedness   Complete by: As directed    Call MD for:  persistant nausea and vomiting   Complete by: As directed    Call MD for:  redness, tenderness, or signs of infection (pain, swelling, redness, odor or green/yellow discharge around incision site)   Complete by: As directed    Call MD for:  severe uncontrolled pain   Complete by: As directed    Call MD for:  temperature >100.4   Complete by: As directed    Diet - low sodium heart healthy   Complete by: As directed    Discharge instructions   Complete by: As directed    You were seen for high blood pressure and heart failure.  You've improved with lasix and new blood pressure medicines.  We'll prescribe these for you to pick up at your pharmacy outpatient.  It's extremely important you follow up with your PCP as an outpatient as well as the cardiologist.  You had headaches and your head CT was concerning for idiopathic  intracranial hypertension.  This can potentially cause vision loss.  Please follow up with neurology and ophthalmology outpatient.  You may need an LP outpatient.    Abstinence from alcohol is important.  Follow this up with your PCP.  Return for new, recurrent, or worsening symptoms.  Please ask your PCP to request records from this hospitalization so they know what was done and what the next steps will be.   Increase activity slowly   Complete by: As directed       Allergies as of 11/04/2020       Reactions   Bee Venom Swelling   Aspirin Nausea And Vomiting        Medication List     TAKE these medications    albuterol 108 (90 Base) MCG/ACT inhaler Commonly known as: VENTOLIN HFA Inhale 2 puffs  into the lungs every 4 (four) hours as needed for wheezing or shortness of breath.   furosemide 40 MG tablet Commonly known as: LASIX Take 1 tablet (40 mg total) by mouth daily. Start taking on: November 05, 2020   losartan 50 MG tablet Commonly known as: COZAAR Take 1 tablet (50 mg total) by mouth daily. Start taking on: November 05, 2020   metoprolol succinate 25 MG 24 hr tablet Commonly known as: TOPROL-XL Take 1 tablet (25 mg total) by mouth daily. Start taking on: November 05, 2020       Allergies  Allergen Reactions   Bee Venom Swelling   Aspirin Nausea And Vomiting      The results of significant diagnostics from this hospitalization (including imaging, microbiology, ancillary and laboratory) are listed below for reference.    Significant Diagnostic Studies: DG Chest 2 View  Result Date: 11/01/2020 CLINICAL DATA:  chest pain EXAM: CHEST - 2 VIEW COMPARISON:  07/11/2019. FINDINGS: Moderate enlargement the cardiac silhouette, which appears mildly increased from the prior. Pulmonary vascular congestion. No consolidation. No visible pleural effusions or pneumothorax. IMPRESSION: 1. Moderate enlargement of the cardiac silhouette, which appears mildly increased from the prior. Echocardiogram could better evaluate for underlying pericardial effusion if clinically indicated. 2. Pulmonary vascular congestion without overt pulmonary edema. Electronically Signed   By: Feliberto Harts M.D.   On: 11/01/2020 13:34   CT HEAD WO CONTRAST ( )  Result Date: 11/04/2020 CLINICAL DATA:  Chronic headache, new features or increased frequency EXAM: CT HEAD WITHOUT CONTRAST TECHNIQUE: Contiguous axial images were obtained from the base of the skull through the vertex without intravenous contrast. COMPARISON:  03/30/2019 FINDINGS: Brain: No evidence of acute infarction, hemorrhage, cerebral edema, mass, mass effect, or midline shift. Ventricles and sulci are within normal limits for age. No  extra-axial fluid collection. Partial empty sella with Delonte Musich somewhat expanded sella turcica. Vascular: No hyperdense vessel or unexpected calcification. Skull: Normal. Negative for fracture or focal lesion. Sinuses/Orbits: Left phthisis bulbi. Normal right globe. Proptosis. The paranasal sinuses are clear. Other: The mastoids are well aerated. IMPRESSION: 1. No acute intracranial process. 2. 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 Correlate with symptoms and opening pressure. Electronically Signed   By: Wiliam Ke M.D.   On: 11/04/2020 15:04   CT Angio Chest Pulmonary Embolism (PE) W or WO Contrast  Result Date: 11/03/2020 CLINICAL DATA:  PE suspected EXAM: CT ANGIOGRAPHY CHEST WITH CONTRAST TECHNIQUE: Multidetector CT imaging of the chest was performed using the standard protocol during bolus administration of intravenous contrast. Multiplanar CT  image reconstructions and MIPs were obtained to evaluate the vascular anatomy. CONTRAST:  OMNIPAQUE IOHEXOL 350 MG/ML SOLN COMPARISON:  06/11/2019 FINDINGS: Cardiovascular: Satisfactory opacification of the pulmonary arteries to the segmental level. No evidence of pulmonary embolism. Cardiomegaly. No pericardial effusion. Mediastinum/Nodes: No enlarged mediastinal, hilar, or axillary lymph nodes. Thyroid gland, trachea, and esophagus demonstrate no significant findings. Lungs/Pleura: Mild bibasilar scarring and or atelectasis (series 6, image 62). No pleural effusion or pneumothorax. Upper Abdomen: No acute abnormality. Musculoskeletal: No chest wall abnormality. No acute or significant osseous findings. Review of the MIP images confirms the above findings. IMPRESSION: 1. Negative examination for pulmonary embolism. 2. Mild bibasilar scarring and or atelectasis. 3. Cardiomegaly. Electronically Signed   By: Jearld Lesch M.D.   On: 11/03/2020 15:00   US  Venous Img Lower Bilateral (DVT)  Result Date: 11/03/2020 CLINICAL DATA:  Bilateral lower extremity edema. EXAM: BILATERAL LOWER EXTREMITY VENOUS DOPPLER ULTRASOUND TECHNIQUE: Gray-scale sonography with graded compression, as well as color Doppler and duplex ultrasound were performed to evaluate the lower extremity deep venous systems from the level of the common femoral vein and including the common femoral, femoral, profunda femoral, popliteal and calf veins including the posterior tibial, peroneal and gastrocnemius veins when visible. The superficial great saphenous vein was also interrogated. Spectral Doppler was utilized to evaluate flow at rest and with distal augmentation maneuvers in the common femoral, femoral and popliteal veins. COMPARISON:  None. FINDINGS: RIGHT LOWER EXTREMITY Common Femoral Vein: No evidence of thrombus. Normal compressibility, respiratory phasicity and response to augmentation. Saphenofemoral Junction: No evidence of thrombus. Normal compressibility and flow on color Doppler imaging. Profunda Femoral Vein: No evidence of thrombus. Normal compressibility and flow on color Doppler imaging. Femoral Vein: No evidence of thrombus. Normal compressibility, respiratory phasicity and response to augmentation. Popliteal Vein: No evidence of thrombus. Normal compressibility, respiratory phasicity and response to augmentation. Calf Veins: No evidence of thrombus. Normal compressibility and flow on color Doppler imaging. Superficial Great Saphenous Vein: No evidence of thrombus. Normal compressibility. Venous Reflux:  None. Other Findings: No evidence of superficial thrombophlebitis or abnormal fluid collection. LEFT LOWER EXTREMITY Common Femoral Vein: No evidence of thrombus. Normal compressibility, respiratory phasicity and response to augmentation. Saphenofemoral Junction: No evidence of thrombus. Normal compressibility and flow on color Doppler imaging. Profunda Femoral Vein: No evidence of  thrombus. Normal compressibility and flow on color Doppler imaging. Femoral Vein: No evidence of thrombus. Normal compressibility, respiratory phasicity and response to augmentation. Popliteal Vein: No evidence of thrombus. Normal compressibility, respiratory phasicity and response to augmentation. Calf Veins: No evidence of thrombus. Normal compressibility and flow on color Doppler imaging. Superficial Great Saphenous Vein: No evidence of thrombus. Normal compressibility. Venous Reflux:  None. Other Findings: No evidence of superficial thrombophlebitis or abnormal fluid collection. IMPRESSION: No evidence of deep venous thrombosis in either lower extremity. Electronically Signed   By: Irish Lack M.D.   On: 11/03/2020 10:11   ECHOCARDIOGRAM COMPLETE  Result Date: 11/02/2020    ECHOCARDIOGRAM REPORT   Patient Name:   KATIUSKA MALINSKI Date of Exam: 11/02/2020 Medical Rec #:  [id number]          Height:       69.0 in Accession #:    [id number]         Weight:       300.0 lb Date of Birth:  11-19-1980          BSA:          2.455 m Patient  Age:    40 years           BP:           147/106 mmHg Patient Gender: F                  HR:           86 bpm. Exam Location:  [facility] Procedure: 2D Echo, Cardiac Doppler and Color Doppler Indications:     Dyspnea R06.00  History:         Patient has no prior history of Echocardiogram examinations.                  Risk Factors:Hypertension.  Sonographer:     Cristela Blue Referring Phys:  [id number] ERIC J [place] Diagnosing Phys: Cristal Deer End MD  Sonographer Comments: Technically challenging study due to limited acoustic windows, no apical window and no subcostal window. IMPRESSIONS  1. Left ventricular ejection fraction, by estimation, is 45 to 50%. The left ventricle has mildly decreased function. Left ventricular endocardial border not optimally defined to evaluate regional wall motion. The left ventricular internal cavity size was mildly dilated. There is mild left  ventricular hypertrophy. Left ventricular diastolic function could not be evaluated.  2. Right ventricular systolic function is mildly reduced. The right ventricular size is normal. Tricuspid regurgitation signal is inadequate for assessing PA pressure.  3. The mitral valve is abnormal. Trivial mitral valve regurgitation.  4. The aortic valve is tricuspid. Aortic valve regurgitation not well assessed. FINDINGS  Left Ventricle: Left ventricular ejection fraction, by estimation, is 45 to 50%. The left ventricle has mildly decreased function. Left ventricular endocardial border not optimally defined to evaluate regional wall motion. The left ventricular internal cavity size was mildly dilated. There is mild left ventricular hypertrophy. Left ventricular diastolic function could not be evaluated. Right Ventricle: The right ventricular size is normal. No increase in right ventricular wall thickness. Right ventricular systolic function is mildly reduced. Tricuspid regurgitation signal is inadequate for assessing PA pressure. Left Atrium: Left atrial size was not well visualized. Right Atrium: Right atrial size was not well visualized. Pericardium: The pericardium was not well visualized. Mitral Valve: The mitral valve is abnormal. Trivial mitral valve regurgitation. Tricuspid Valve: The tricuspid valve is not well visualized. Tricuspid valve regurgitation is trivial. Aortic Valve: The aortic valve is tricuspid. Aortic valve regurgitation not well assessed. Pulmonic Valve: The pulmonic valve was not well visualized. Pulmonic valve regurgitation is not visualized. No evidence of pulmonic stenosis. Aorta: The aortic root is normal in size and structure. Pulmonary Artery: The pulmonary artery is not well seen. Venous: The inferior vena cava was not well visualized. IAS/Shunts: The interatrial septum was not well visualized.  LEFT VENTRICLE PLAX 2D LVIDd:         5.27 cm LVIDs:         3.83 cm LV PW:         1.28 cm LV IVS:         1.32 cm LVOT diam:     2.00 cm LVOT Area:     3.14 cm  LEFT ATRIUM         Index LA diam:    3.40 cm 1.39 cm/m                        PULMONIC VALVE AORTA                 PV Vmax:  0.84 m/s Ao Root diam: 3.07 cm PV Vmean:       51.300 cm/s                       PV VTI:         0.132 m                       PV Peak grad:   2.8 mmHg                       PV Mean grad:   1.5 mmHg                       RVOT Peak grad: 3 mmHg   SHUNTS Systemic Diam: 2.00 cm Pulmonic VTI:  0.132 m Yvonne Kendall MD Electronically signed by Yvonne Kendall MD Signature Date/Time: 11/02/2020/4:11:44 PM    Final     Microbiology: Recent Results (from the past 240 hour(s))  Resp Panel by RT-PCR (Flu Kyndra Condron&B, Covid) Nasopharyngeal Swab     Status: None   Collection Time: 11/02/20  8:40 AM   Specimen: Nasopharyngeal Swab; Nasopharyngeal(NP) swabs in vial transport medium  Result Value Ref Range Status   SARS Coronavirus 2 by RT PCR NEGATIVE NEGATIVE Final    Comment: (NOTE) SARS-CoV-2 target nucleic acids are NOT DETECTED.  The SARS-CoV-2 RNA is generally detectable in upper respiratory specimens during the acute phase of infection. The lowest concentration of SARS-CoV-2 viral copies this assay can detect is 138 copies/mL. Tomara Youngberg negative result does not preclude SARS-Cov-2 infection and should not be used as the sole basis for treatment or other patient management decisions. Donicia Druck negative result may occur with  improper specimen collection/handling, submission of specimen other than nasopharyngeal swab, presence of viral mutation(s) within the areas targeted by this assay, and inadequate number of viral copies(<138 copies/mL). Zeya Balles negative result must be combined with clinical observations, patient history, and epidemiological information. The expected result is Negative.  Fact Sheet for Patients:  BloggerCourse.com  Fact Sheet for Healthcare Providers:   SeriousBroker.it  This test is no t yet approved or cleared by the [place] FDA and  has been authorized for detection and/or diagnosis of SARS-CoV-2 by FDA under an Emergency Use Authorization (EUA). This EUA will remain  in effect (meaning this test can be used) for the duration of the COVID-19 declaration under Section 564(b)(1) of the Act, 21 U.S.C.section 360bbb-3(b)(1), unless the authorization is terminated  or revoked sooner.       Influenza Cayleb Jarnigan by PCR NEGATIVE NEGATIVE Final   Influenza B by PCR NEGATIVE NEGATIVE Final    Comment: (NOTE) The Xpert Xpress SARS-CoV-2/FLU/RSV plus assay is intended as an aid in the diagnosis of influenza from Nasopharyngeal swab specimens and should not be used as Rosine Solecki sole basis for treatment. Nasal washings and aspirates are unacceptable for Xpert Xpress SARS-CoV-2/FLU/RSV testing.  Fact Sheet for Patients: BloggerCourse.com  Fact Sheet for Healthcare Providers: SeriousBroker.it  This test is not yet approved or cleared by the [place] FDA and has been authorized for detection and/or diagnosis of SARS-CoV-2 by FDA under an Emergency Use Authorization (EUA). This EUA will remain in effect (meaning this test can be used) for the duration of the COVID-19 declaration under Section 564(b)(1) of the Act, 21 U.S.C. section 360bbb-3(b)(1), unless the authorization is terminated or revoked.  Performed at [facility], [address] [place], [place]  [address]      Labs: Basic Metabolic Panel: Recent Labs  Lab 11/01/20 1242 11/02/20 1516 11/03/20 0501 11/04/20 0516  NA 142  --  141 136  K 4.1  --  3.5 3.9  CL 103  --  97* 95*  CO2 33*  --  38* 34*  GLUCOSE 103*  --  104* 98  BUN 9  --  11 11  CREATININE 0.67 0.62 0.77 0.63  CALCIUM 8.4*  --  8.8* 9.2  MG  --   --  1.9 1.9  PHOS  --   --   --  4.4   Liver Function Tests: Recent Labs   Lab 11/04/20 0516  AST 16  ALT 15  ALKPHOS 63  BILITOT 1.1  PROT 7.2  ALBUMIN 3.7   No results for input(s): LIPASE, AMYLASE in the last 168 hours. No results for input(s): AMMONIA in the last 168 hours. CBC: Recent Labs  Lab 11/01/20 1242 11/02/20 1516 11/04/20 0516  WBC 5.3 6.2 7.7  NEUTROABS  --   --  5.7  HGB 12.6 13.0 12.6  HCT 39.9 43.5 41.5  MCV 102.6* 103.3* 101.0*  PLT 294 316 309   Cardiac Enzymes: No results for input(s): CKTOTAL, CKMB, CKMBINDEX, TROPONINI in the last 168 hours. BNP: BNP (last 3 results) Recent Labs    11/01/20 1242  BNP 330.2*    ProBNP (last 3 results) No results for input(s): PROBNP in the last 8760 hours.  CBG: No results for input(s): GLUCAP in the last 168 hours.     Signed:  Lacretia Nicks MD.  [facility] 11/04/2020, 6:06 PM

## 2020-11-15 ENCOUNTER — Other Ambulatory Visit: Payer: Self-pay

## 2020-11-15 ENCOUNTER — Encounter: Payer: Self-pay | Admitting: Family

## 2020-11-15 ENCOUNTER — Ambulatory Visit: Payer: Medicaid Other | Attending: Family | Admitting: Family

## 2020-11-15 VITALS — BP 138/95 | HR 105 | Resp 20 | Ht 69.0 in | Wt 309.5 lb

## 2020-11-15 DIAGNOSIS — I5022 Chronic systolic (congestive) heart failure: Secondary | ICD-10-CM

## 2020-11-15 DIAGNOSIS — G4730 Sleep apnea, unspecified: Secondary | ICD-10-CM | POA: Insufficient documentation

## 2020-11-15 DIAGNOSIS — I110 Hypertensive heart disease with heart failure: Secondary | ICD-10-CM | POA: Insufficient documentation

## 2020-11-15 DIAGNOSIS — F17210 Nicotine dependence, cigarettes, uncomplicated: Secondary | ICD-10-CM | POA: Diagnosis not present

## 2020-11-15 DIAGNOSIS — Z79899 Other long term (current) drug therapy: Secondary | ICD-10-CM | POA: Diagnosis not present

## 2020-11-15 DIAGNOSIS — I10 Essential (primary) hypertension: Secondary | ICD-10-CM | POA: Diagnosis not present

## 2020-11-15 DIAGNOSIS — I509 Heart failure, unspecified: Secondary | ICD-10-CM | POA: Diagnosis not present

## 2020-11-15 DIAGNOSIS — Z789 Other specified health status: Secondary | ICD-10-CM

## 2020-11-15 DIAGNOSIS — Z720 Tobacco use: Secondary | ICD-10-CM

## 2020-11-15 DIAGNOSIS — F1010 Alcohol abuse, uncomplicated: Secondary | ICD-10-CM | POA: Diagnosis not present

## 2020-11-15 DIAGNOSIS — Z713 Dietary counseling and surveillance: Secondary | ICD-10-CM | POA: Insufficient documentation

## 2020-11-15 DIAGNOSIS — G4733 Obstructive sleep apnea (adult) (pediatric): Secondary | ICD-10-CM

## 2020-11-15 DIAGNOSIS — R400 Somnolence: Secondary | ICD-10-CM | POA: Insufficient documentation

## 2020-11-15 DIAGNOSIS — Z6841 Body Mass Index (BMI) 40.0 and over, adult: Secondary | ICD-10-CM | POA: Diagnosis not present

## 2020-11-15 MED ORDER — EMPAGLIFLOZIN 10 MG PO TABS: 10.0000 mg | ORAL_TABLET | Freq: Every day | ORAL | 5 refills | Status: DC

## 2020-11-15 NOTE — Patient Instructions (Addendum)
Call to schedule an appt with Dr. [facility] [address] [place], [place], [place] [address] [phone]   Start taking [place] Pepper once a day  Get some compression socks from the pharmacy  Weigh yourself daily, track weight on log provided. Please call heart failure office for wt gain >3lbs/overnight or 5 lbs/week.

## 2020-11-15 NOTE — Progress Notes (Signed)
KAYNA SUPPA NTZ:[id number] DOB: 05/11/1980   HPI  Carmen Gomez is a 40 y.o. female with medical history significant of essential hypertension, morbid obesity, tobacco use, ETOH use.   Echo 11/02/20: 1. Left ventricular ejection fraction, by estimation, is 45 to 50%. The left ventricle has mildly decreased function. Left ventricular endocardial border not optimally defined to evaluate regional wall motion. The left ventricular internal cavity size was mildly dilated. There is mild left ventricular hypertrophy. Left ventricular diastolic function could not be evaluated.   2. Right ventricular systolic function is mildly reduced. The right  ventricular size is normal. Tricuspid regurgitation signal is inadequate for assessing PA pressure.  Recently hospitalized 11/1-11/3/22 for acute hypoxic respiratory failure, hypertensive emergency and congestive heart failure exacerbation. She was started on antihypertensive medications, diuresed and discharged on cozaar, metoprolol, and lasix.   She presents today for her [facility] appointment [facility]. She is alert but somnolent and frequently nods off. She reports that she had moderate fatigue with minimal exertion and this has been going on for several months. She denies CP, no worsening SOB, palpitations, dizziness, or abdominal swelling.   She does not weigh herself daily. She does check her BP several times/day and reports that it is usually 140's-150's/100-120's. This am at home it was 138/95 after taking her medications for the day.  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   Cholelithiasis    Depression    NO MEDS   Diplopia    GERD (gastroesophageal reflux disease)    NO MEDS   Hypertension    Morbid obesity with BMI of 45.0-49.9, adult [facility])    Past Surgical History:  Procedure Laterality Date   CHOLECYSTECTOMY N/A 10/25/2016    Procedure: LAPAROSCOPIC CHOLECYSTECTOMY;  Surgeon: Leafy Ro, MD;  Location: [facility] ORS;  Service: General;  Laterality: N/A;   EYE SURGERY Left    DETACHED RETINA   HERNIA REPAIR     UMBILICAL HERNIA REPAIR N/A 10/25/2016   Procedure: HERNIA REPAIR UMBILICAL ADULT;  Surgeon: Leafy Ro, MD;  Location: [facility] ORS;  Service: General;  Laterality: N/A;   Family History  Problem Relation Age of Onset   Stroke Sister 30   Diabetes Maternal Grandmother    Social History   Tobacco Use   Smoking status: Every Day    Packs/day: 0.50    Years: 18.00    Pack years: 9.00    Types: Cigarettes   Smokeless tobacco: Never  Substance Use Topics   Alcohol use: Yes    Comment: Approximately 12-18 Beers weekly   Allergies  Allergen Reactions   Bee Venom Swelling   Aspirin Nausea And Vomiting   Prior to Admission medications   Medication Sig Start Date End Date Taking? Authorizing Provider  albuterol (VENTOLIN HFA) 108 (90 Base) MCG/ACT inhaler Inhale 2 puffs into the lungs every 4 (four) hours as needed for wheezing or shortness of breath. 06/11/19  Yes Shaune Pollack, MD  furosemide (LASIX) 40 MG tablet Take 1 tablet (40 mg total) by mouth daily. 11/05/20 12/05/20 Yes Zigmund Daniel., MD  losartan (COZAAR) 50 MG tablet Take 1 tablet (50 mg total) by mouth daily. 11/05/20 12/05/20 Yes Zigmund Daniel., MD  metoprolol succinate (TOPROL-XL) 25 MG 24 hr tablet Take 1 tablet (25 mg total) by mouth daily. 11/05/20 12/05/20 Yes Zigmund Daniel.,  MD    Review of Systems  Constitutional:  Positive for malaise/fatigue.  HENT:  Negative for congestion.   Eyes:  Negative for blurred vision and double vision.  Respiratory:  Positive for shortness of breath. Negative for hemoptysis.   Cardiovascular:  Positive for leg swelling. Negative for chest pain, palpitations and orthopnea.  Gastrointestinal: Negative.   Genitourinary: Negative.   Musculoskeletal:  Negative for myalgias.  Skin:   Positive for rash (RL shin, present for years).  Neurological:  Negative for dizziness, weakness and headaches.  Endo/Heme/Allergies: Negative.   Psychiatric/Behavioral:  Positive for substance abuse (drinks 6 beers/day). The patient has insomnia ("stays up all night watching TV"; has been told she has sleep apnea).     Vitals with BMI 11/15/2020 11/04/2020 11/04/2020  Height 5\' 9"  - -  Weight 309 lbs 8 oz - -  BMI 45.68 - -  Systolic 138 131  Diastolic 95 77 94  Pulse 105 97 97  Some encounter information is confidential and restricted. Go to Review Flowsheets activity to see all data.    Filed Weights   11/15/20 1140  Weight: (!) 309 lb 8 oz (140.4 kg)    Lab Results  Component Value Date   CREATININE 0.63 11/04/2020   CREATININE 0.77 11/03/2020   CREATININE 0.62 11/02/2020   Physical Activity: Not on file   Physical Exam Vitals and nursing note reviewed.  Constitutional:      Appearance: She is obese.  Neck:     Vascular: No JVD.  Cardiovascular:     Rate and Rhythm: Regular rhythm. Tachycardia present.     Heart sounds: Normal heart sounds. No murmur heard.   No gallop.  Pulmonary:     Breath sounds: Normal breath sounds. No decreased breath sounds, wheezing or rhonchi.  Abdominal:     General: Bowel sounds are normal.     Tenderness: There is no abdominal tenderness.  Musculoskeletal:     Right lower leg: Edema (+1) present.     Left lower leg: Edema (+ 2) present.  Skin:    General: Skin is warm and dry.  Neurological:     Mental Status: She is alert and oriented to person, place, and time.  Psychiatric:        Mood and Affect: Mood is not anxious.        Behavior: Behavior is not agitated.     Assessment & Plan:  1: Heart failure with mildly reduced ejection fraction- - NYHA class III - euvolemic today - weight 309 lbs, does not weigh routinely - educated to weigh daily and report wt gain of 3 lbs/night or 5 lbs/week - scale provided today -  discussed adhering to < 2g Na/day diet - low sodium cookbook provided - BNP 330 on 11/02/20 - on GDMT of cozaar and metoprolol - was to f/u with Dr. 13/1/22, provided contact information and encouraged to call for f/u visit - will start Jardiance 10 mg QD  2: Hypertension- - BP 138/95 in office - She does check her BP several times/day at home and reports that it is usually 140's-150's/100-120's - cozaar and metoprolol - [facility] for [facility] - BMP reviewed 11/04/20 Sodium 136, potassium 3.9, Cr. 0.63, GFR > 60   3: ETOH abuse - reports consuming 6 beers/day - counseled on deleterious effects this has directly on the heart  4: Smoking cessation - is trying to cut back, smoking 5-6/day  5: Sleep apnea - pt somnolent and nodding  off during visit - reports she stays up all night and sleeps most of the day - has been told she has sleep apnea but has not had sleep study, will assist with arranging   Medications reviewed.   Return in two weeks, or sooner for HF symptoms.

## 2020-11-29 NOTE — Progress Notes (Deleted)
Carmen Gomez:[id number] DOB: 25-Jul-1980   HPI  Carmen Gomez is a 40 y.o. female with medical history significant of essential hypertension, morbid obesity, tobacco use, ETOH use.   Echo 11/02/20: 1. Left ventricular ejection fraction, by estimation, is 45 to 50%. The left ventricle has mildly decreased function. Left ventricular endocardial border not optimally defined to evaluate regional wall motion. The left ventricular internal cavity size was mildly dilated. There is mild left ventricular hypertrophy. Left ventricular diastolic function could not be evaluated.   2. Right ventricular systolic function is mildly reduced. The right  ventricular size is normal. Tricuspid regurgitation signal is inadequate for assessing PA pressure.  Recently hospitalized 11/1-11/3/22 for acute hypoxic respiratory failure, hypertensive emergency and congestive heart failure exacerbation. She was started on antihypertensive medications, diuresed and discharged on cozaar, metoprolol, and lasix.   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   Cholelithiasis    Depression    NO MEDS   Diplopia    GERD (gastroesophageal reflux disease)    NO MEDS   Hypertension    Morbid obesity with BMI of 45.0-49.9, adult [facility])    Past Surgical History:  Procedure Laterality Date   CHOLECYSTECTOMY N/A 10/25/2016   Procedure: LAPAROSCOPIC CHOLECYSTECTOMY;  Surgeon: Leafy Ro, MD;  Location: [facility] ORS;  Service: General;  Laterality: N/A;   EYE SURGERY Left    DETACHED RETINA   HERNIA REPAIR     UMBILICAL HERNIA REPAIR N/A 10/25/2016   Procedure: HERNIA REPAIR UMBILICAL ADULT;  Surgeon: Leafy Ro, MD;  Location: [facility] ORS;  Service: General;  Laterality: N/A;   Family History  Problem Relation Age of Onset   Stroke Sister 30   Diabetes Maternal  Grandmother    Social History   Tobacco Use   Smoking status: Every Day    Packs/day: 0.50    Years: 18.00    Pack years: 9.00    Types: Cigarettes   Smokeless tobacco: Never  Substance Use Topics   Alcohol use: Yes    Comment: Approximately 12-18 Beers weekly   Allergies  Allergen Reactions   Bee Venom Swelling   Aspirin Nausea And Vomiting     Review of Systems  Constitutional:  Positive for malaise/fatigue.  HENT:  Negative for congestion.   Eyes:  Negative for blurred vision and double vision.  Respiratory:  Positive for shortness of breath. Negative for hemoptysis.   Cardiovascular:  Positive for leg swelling. Negative for chest pain, palpitations and orthopnea.  Gastrointestinal: Negative.   Genitourinary: Negative.   Musculoskeletal:  Negative for myalgias.  Skin:  Positive for rash (RL shin, present for years).  Neurological:  Negative for dizziness, weakness and headaches.  Endo/Heme/Allergies: Negative.   Psychiatric/Behavioral:  Positive for substance abuse (drinks 6 beers/day). The patient has insomnia ("stays up all night watching TV"; has been told she has sleep apnea).         Physical Exam Vitals and nursing note reviewed.  Constitutional:      Appearance: She is obese.  Neck:     Vascular: No JVD.  Cardiovascular:     Rate and Rhythm: Regular rhythm. Tachycardia present.     Heart sounds: Normal heart sounds. No murmur heard.   No gallop.  Pulmonary:     Breath sounds: Normal breath  sounds. No decreased breath sounds, wheezing or rhonchi.  Abdominal:     General: Bowel sounds are normal.     Tenderness: There is no abdominal tenderness.  Musculoskeletal:     Right lower leg: Edema (+1) present.     Left lower leg: Edema (+ 2) present.  Skin:    General: Skin is warm and dry.  Neurological:     Mental Status: She is alert and oriented to person, place, and time.  Psychiatric:        Mood and Affect: Mood is not anxious.        Behavior:  Behavior is not agitated.     Assessment & Plan:  1: Heart failure with mildly reduced ejection fraction- - NYHA class III - euvolemic today - weighing daily; reminded to call for an overnight weight gain of > 2 pounds or a weekly weight gain of > 5 pounds - weight 309.8 from last visit here 2 weeks ago - not adding salt to her food - was to f/u with Dr. [facility], provided contact information and encouraged to call for f/u visit - on GDMT of cozaar, jardiance and metoprolol - will check BMP today since jardiance started at last visit - BNP 330 on 11/02/20  2: Hypertension- - BP  - She does check her BP several times/day at home and reports that it is usually 140's-150's/100-120's - goes to [facility] for [facility] - BMP reviewed 11/04/20 Sodium 136, potassium 3.9, Cr. 0.63, GFR > 60   3: ETOH abuse - reports consuming 6 beers/day - counseled on deleterious effects this has directly on the heart  4: Smoking cessation - is trying to cut back, smoking 5-6/day  5: Sleep apnea - pt somnolent and nodding off during visit - reports she stays up all night and sleeps most of the day - has been told she has sleep apnea but has not had sleep study, will assist with arranging   Medications reviewed.

## 2020-11-30 ENCOUNTER — Ambulatory Visit: Payer: Medicaid Other | Admitting: Family

## 2020-12-16 ENCOUNTER — Ambulatory Visit: Payer: Medicaid Other | Admitting: Family

## 2020-12-16 ENCOUNTER — Telehealth: Payer: Self-pay | Admitting: Family

## 2020-12-16 NOTE — Telephone Encounter (Signed)
Patient did not show for her [facility] appointment on 12/16/20. Will attempt to reschedule.   °

## 2020-12-21 ENCOUNTER — Ambulatory Visit: Payer: Medicaid Other

## 2020-12-21 ENCOUNTER — Other Ambulatory Visit: Admission: RE | Admit: 2020-12-21 | Payer: Medicaid Other | Source: Ambulatory Visit

## 2020-12-22 ENCOUNTER — Ambulatory Visit: Payer: Medicaid Other

## 2021-01-26 ENCOUNTER — Ambulatory Visit: Payer: Medicaid Other | Attending: Otolaryngology

## 2021-01-26 DIAGNOSIS — Z6841 Body Mass Index (BMI) 40.0 and over, adult: Secondary | ICD-10-CM | POA: Diagnosis not present

## 2021-01-26 DIAGNOSIS — I10 Essential (primary) hypertension: Secondary | ICD-10-CM | POA: Insufficient documentation

## 2021-01-26 DIAGNOSIS — G4733 Obstructive sleep apnea (adult) (pediatric): Secondary | ICD-10-CM | POA: Insufficient documentation

## 2021-01-26 DIAGNOSIS — E669 Obesity, unspecified: Secondary | ICD-10-CM | POA: Insufficient documentation

## 2021-01-26 DIAGNOSIS — R400 Somnolence: Secondary | ICD-10-CM | POA: Insufficient documentation

## 2021-01-28 ENCOUNTER — Other Ambulatory Visit: Payer: Self-pay

## 2021-05-10 DIAGNOSIS — Z1389 Encounter for screening for other disorder: Secondary | ICD-10-CM | POA: Diagnosis not present

## 2021-05-10 DIAGNOSIS — Z0131 Encounter for examination of blood pressure with abnormal findings: Secondary | ICD-10-CM | POA: Diagnosis not present

## 2021-05-10 DIAGNOSIS — I5023 Acute on chronic systolic (congestive) heart failure: Secondary | ICD-10-CM | POA: Diagnosis not present

## 2021-05-16 DIAGNOSIS — Z1389 Encounter for screening for other disorder: Secondary | ICD-10-CM | POA: Diagnosis not present

## 2021-05-16 DIAGNOSIS — I5023 Acute on chronic systolic (congestive) heart failure: Secondary | ICD-10-CM | POA: Diagnosis not present

## 2021-05-16 DIAGNOSIS — Z0131 Encounter for examination of blood pressure with abnormal findings: Secondary | ICD-10-CM | POA: Diagnosis not present

## 2021-06-06 DIAGNOSIS — I10 Essential (primary) hypertension: Secondary | ICD-10-CM | POA: Diagnosis not present

## 2021-06-06 DIAGNOSIS — I5022 Chronic systolic (congestive) heart failure: Secondary | ICD-10-CM | POA: Diagnosis not present

## 2021-06-06 DIAGNOSIS — B353 Tinea pedis: Secondary | ICD-10-CM | POA: Diagnosis not present

## 2021-06-06 DIAGNOSIS — L988 Other specified disorders of the skin and subcutaneous tissue: Secondary | ICD-10-CM | POA: Diagnosis not present

## 2021-12-15 ENCOUNTER — Ambulatory Visit: Payer: Medicaid Other | Admitting: Dermatology

## 2022-01-04 DIAGNOSIS — I110 Hypertensive heart disease with heart failure: Secondary | ICD-10-CM | POA: Diagnosis not present

## 2022-01-04 DIAGNOSIS — Z6841 Body Mass Index (BMI) 40.0 and over, adult: Secondary | ICD-10-CM | POA: Diagnosis not present

## 2022-01-04 DIAGNOSIS — H548 Legal blindness, as defined in USA: Secondary | ICD-10-CM | POA: Diagnosis not present

## 2022-01-04 DIAGNOSIS — Z8249 Family history of ischemic heart disease and other diseases of the circulatory system: Secondary | ICD-10-CM | POA: Diagnosis not present

## 2022-01-04 DIAGNOSIS — G8929 Other chronic pain: Secondary | ICD-10-CM | POA: Diagnosis not present

## 2022-01-04 DIAGNOSIS — F321 Major depressive disorder, single episode, moderate: Secondary | ICD-10-CM | POA: Diagnosis not present

## 2022-01-04 DIAGNOSIS — Z823 Family history of stroke: Secondary | ICD-10-CM | POA: Diagnosis not present

## 2022-01-04 DIAGNOSIS — I509 Heart failure, unspecified: Secondary | ICD-10-CM | POA: Diagnosis not present

## 2022-01-04 DIAGNOSIS — E119 Type 2 diabetes mellitus without complications: Secondary | ICD-10-CM | POA: Diagnosis not present

## 2022-01-04 DIAGNOSIS — Z5982 Transportation insecurity: Secondary | ICD-10-CM | POA: Diagnosis not present

## 2022-01-04 DIAGNOSIS — Z720 Tobacco use: Secondary | ICD-10-CM | POA: Diagnosis not present

## 2022-05-02 DIAGNOSIS — Z1331 Encounter for screening for depression: Secondary | ICD-10-CM | POA: Diagnosis not present

## 2022-05-02 DIAGNOSIS — Z0131 Encounter for examination of blood pressure with abnormal findings: Secondary | ICD-10-CM | POA: Diagnosis not present

## 2022-05-02 DIAGNOSIS — L988 Other specified disorders of the skin and subcutaneous tissue: Secondary | ICD-10-CM | POA: Diagnosis not present

## 2022-05-02 DIAGNOSIS — I5022 Chronic systolic (congestive) heart failure: Secondary | ICD-10-CM | POA: Diagnosis not present

## 2022-05-02 DIAGNOSIS — F331 Major depressive disorder, recurrent, moderate: Secondary | ICD-10-CM | POA: Diagnosis not present

## 2022-05-02 DIAGNOSIS — J4520 Mild intermittent asthma, uncomplicated: Secondary | ICD-10-CM | POA: Diagnosis not present

## 2022-05-02 DIAGNOSIS — I10 Essential (primary) hypertension: Secondary | ICD-10-CM | POA: Diagnosis not present

## 2022-07-20 ENCOUNTER — Telehealth: Payer: Self-pay

## 2022-07-20 NOTE — Telephone Encounter (Signed)
Faxed medical records to [id number] [address] at [phone].

## 2022-12-22 IMAGING — CT CT HEAD W/O CM
3 series · 15 of 47 positions shown, 18 images · non-contrast
Comparison: 03/30/2019

CLINICAL DATA: Chronic headache, new features or increased
frequency

EXAM:
CT HEAD WITHOUT CONTRAST
TECHNIQUE: Contiguous axial images were obtained from the base of the skull
through the vertex without intravenous contrast.

[Series 3: head wo · axial · 0.44mm/px · z∈[-21,+104]mm · 9 of 31 slices shown, 12 images]
[im 3/31  brain]
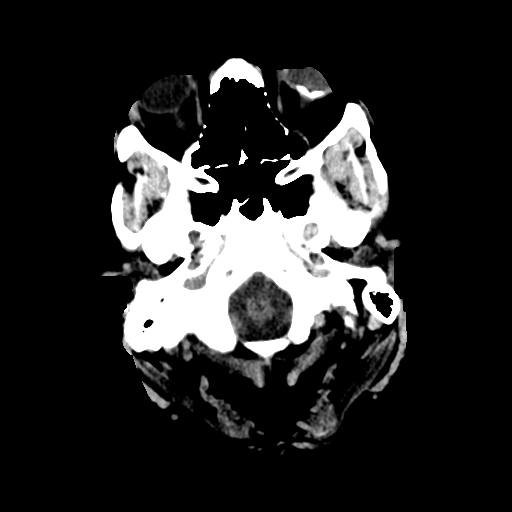
[im 3/31  bone]
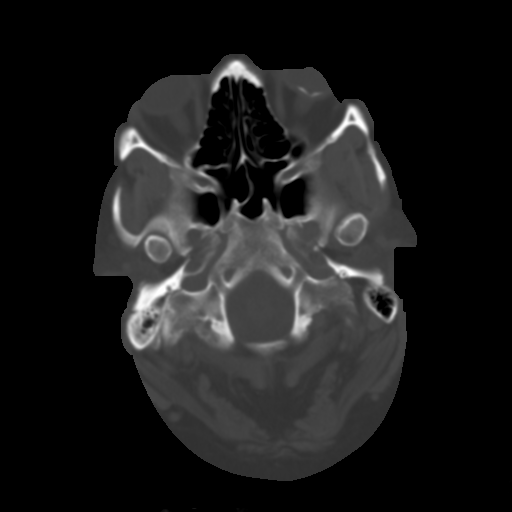
[im 6/31  brain]
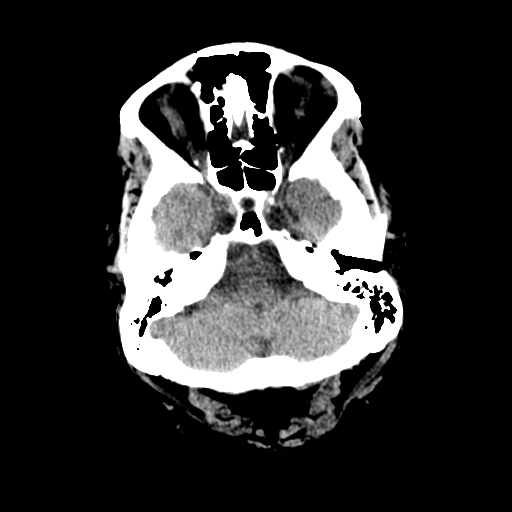
[im 9/31  brain]
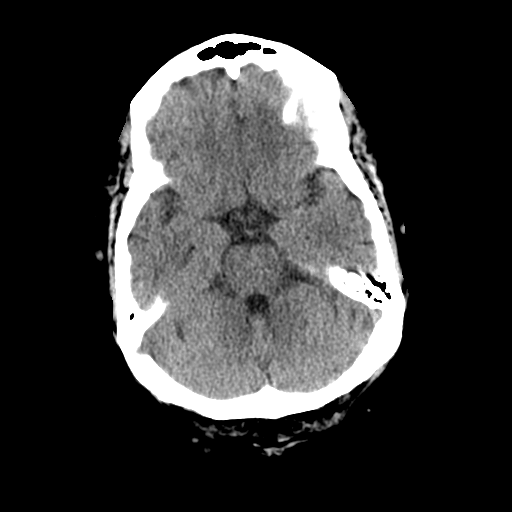
[im 12/31  brain]
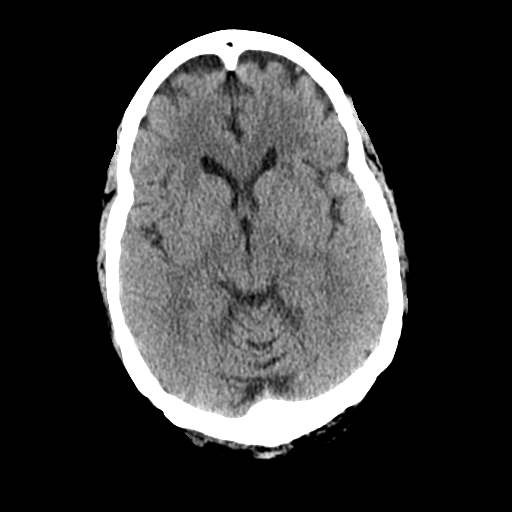
[im 16/31  brain]
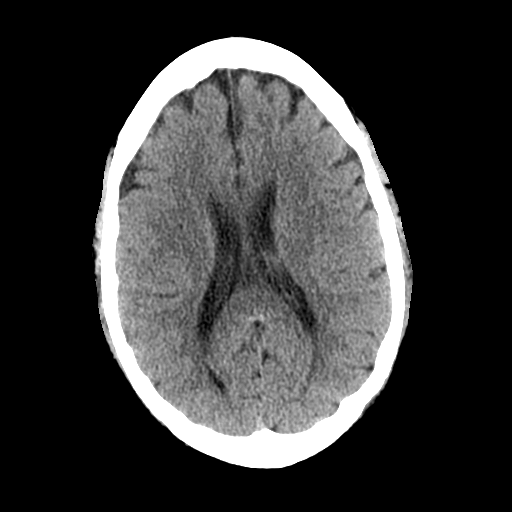
[im 16/31  bone]
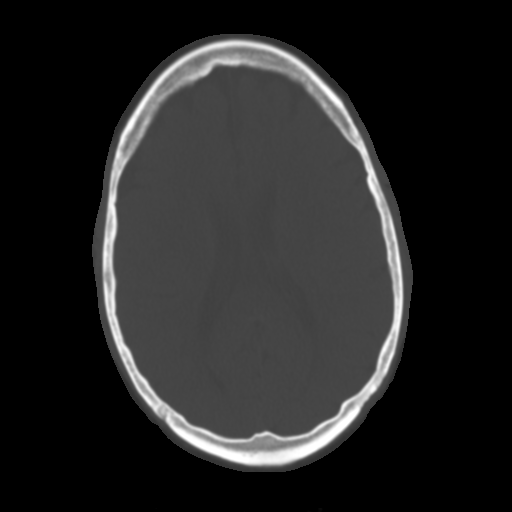
[im 19/31  brain]
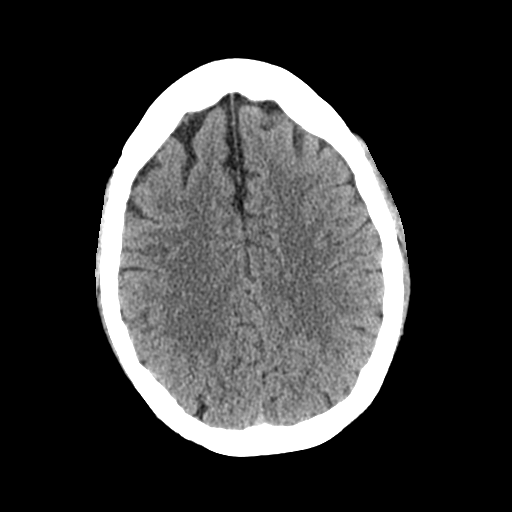
[im 22/31  brain]
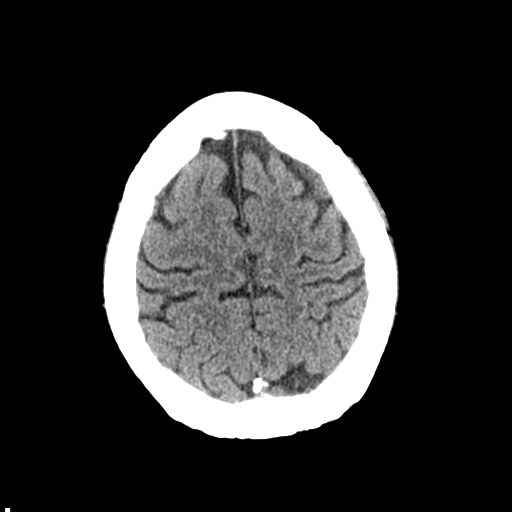
[im 25/31  brain]
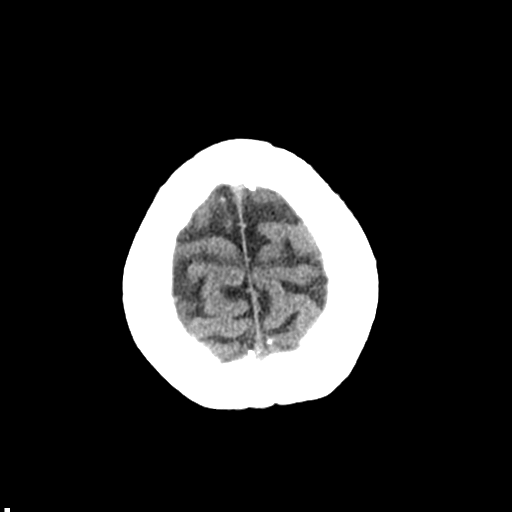
[im 28/31  brain]
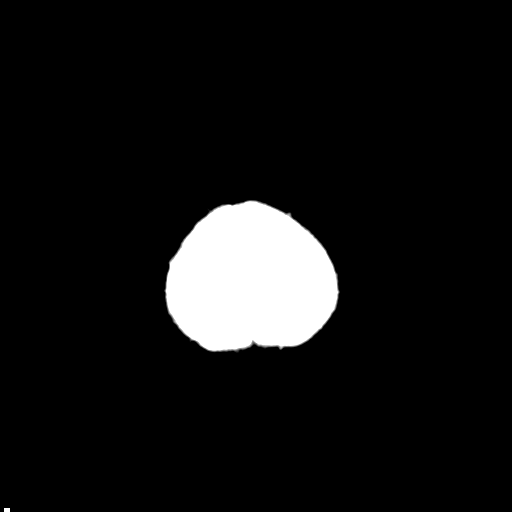
[im 28/31  bone]
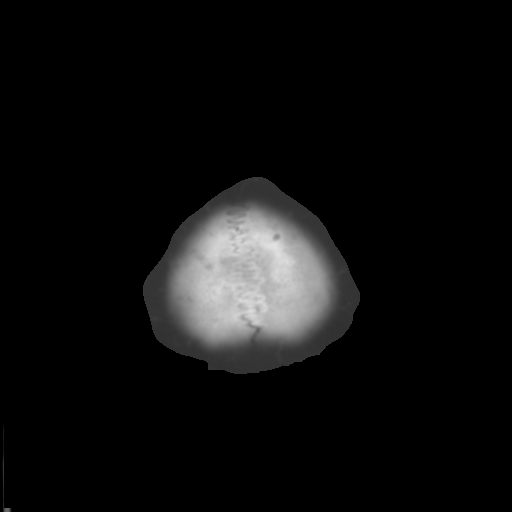

[Series 4: coronal soft tissue · coronal · 0.30mm/px · 3 of 69 slices shown]
[im 23/69  brain]
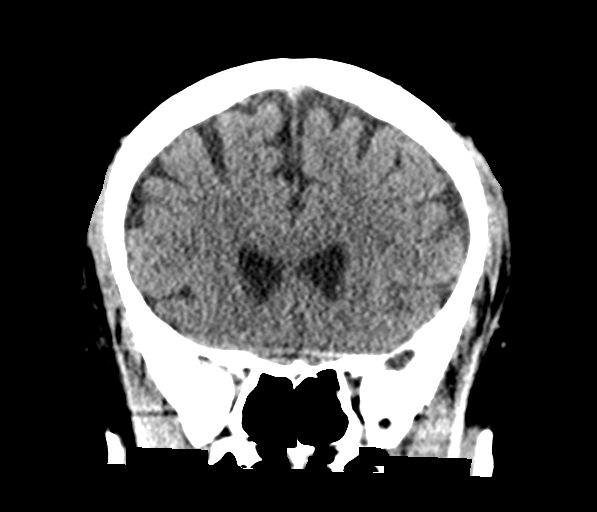
[im 31/69  brain]
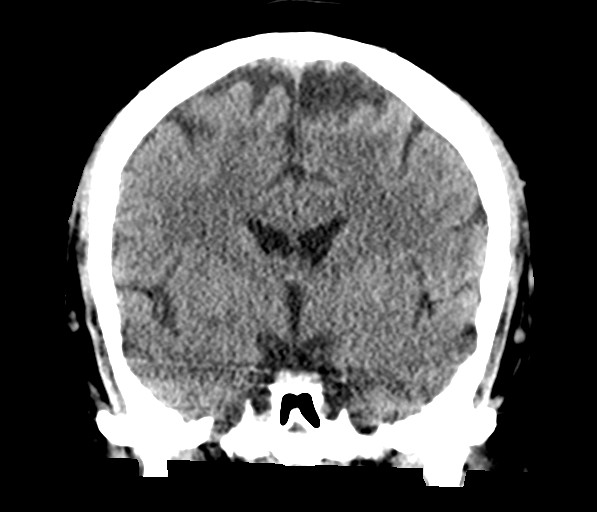
[im 38/69  brain]
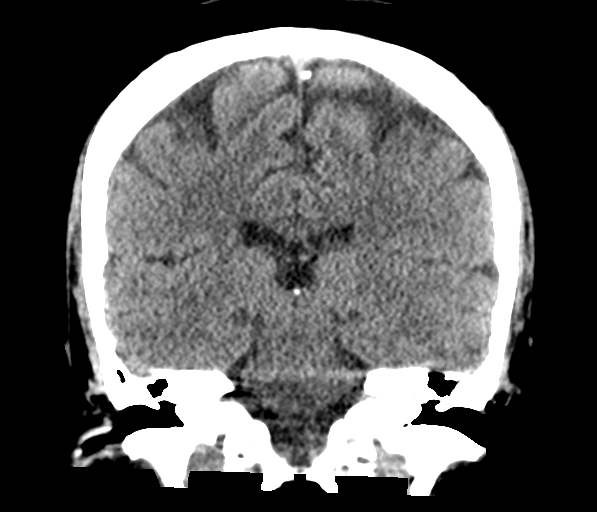

[Series 5: sagittal soft tissue · sagittal · 0.30mm/px · 3 of 60 slices shown]
[im 20/60  brain]
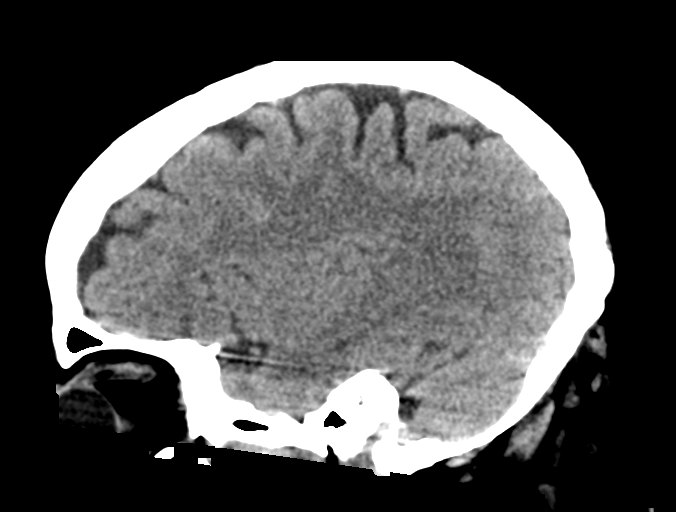
[im 30/60  brain]
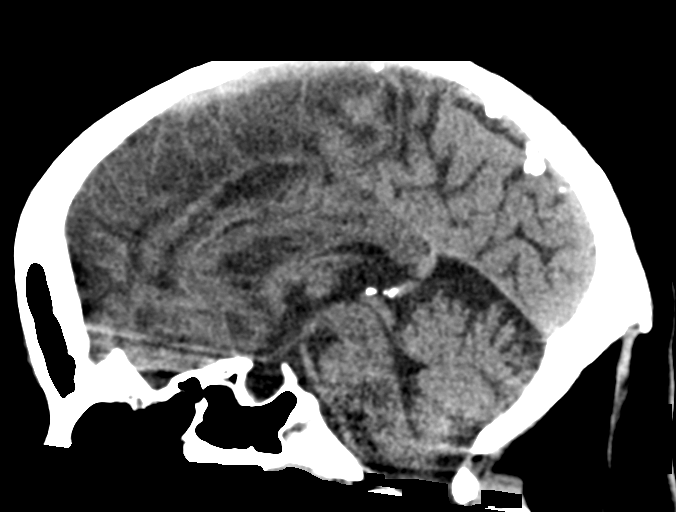
[im 40/60  brain]
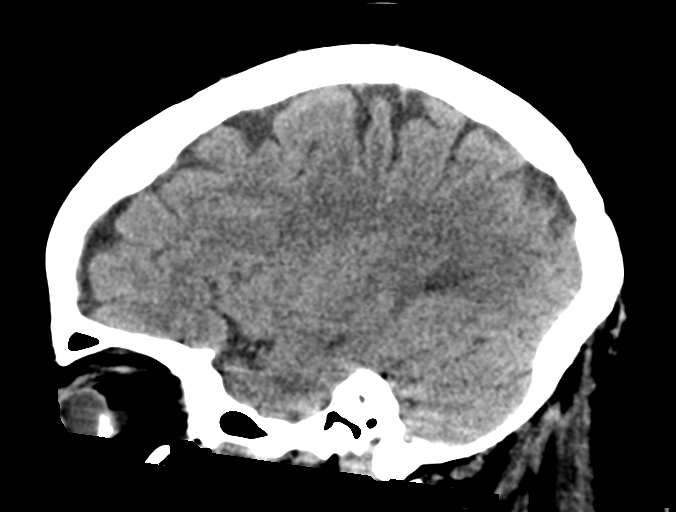

[15 of 47 positions shown; findings below may reference images not displayed]

FINDINGS: Brain: No evidence of acute infarction, hemorrhage, cerebral edema,
mass, mass effect, or midline shift. Ventricles and sulci are within
normal limits for age. No extra-axial fluid collection. Partial
empty sella with a somewhat expanded sella turcica.

Vascular: No hyperdense vessel or unexpected calcification.

Skull: Normal. Negative for fracture or focal lesion.

Sinuses/Orbits: Left phthisis bulbi. Normal right globe. Proptosis.
The paranasal sinuses are clear.

Other: The mastoids are well aerated.
IMPRESSION: 1. No acute intracranial process.
2. Partial empty sella with expansion of the sella turcica, which is
nonspecific but can be seen in the setting of idiopathic
intracranial hypertension. Correlate with symptoms and opening
pressure.

## 2023-02-03 DEATH — deceased
